# Patient Record
Sex: Male | Born: 1970
Health system: Southern US, Community
[De-identification: ages and names within clinical notes are randomized; demographics above are authoritative.]

## PROBLEM LIST (undated history)

## (undated) DIAGNOSIS — F32A Depression, unspecified: Secondary | ICD-10-CM

## (undated) DIAGNOSIS — G709 Myoneural disorder, unspecified: Secondary | ICD-10-CM

## (undated) DIAGNOSIS — F329 Major depressive disorder, single episode, unspecified: Secondary | ICD-10-CM

## (undated) HISTORY — DX: Myoneural disorder, unspecified: G70.9

## (undated) HISTORY — DX: Depression, unspecified: F32.A

## (undated) HISTORY — DX: Major depressive disorder, single episode, unspecified: F32.9

---

## 1998-06-03 ENCOUNTER — Ambulatory Visit (HOSPITAL_COMMUNITY): Admission: RE | Admit: 1998-06-03 | Discharge: 1998-06-03 | Payer: Self-pay | Admitting: Gastroenterology

## 2014-09-30 ENCOUNTER — Ambulatory Visit (INDEPENDENT_AMBULATORY_CARE_PROVIDER_SITE_OTHER): Payer: No Typology Code available for payment source | Admitting: Internal Medicine

## 2014-09-30 VITALS — BP 120/74 | HR 78 | Temp 98.7°F | Resp 16 | Ht 68.0 in | Wt 163.0 lb

## 2014-09-30 DIAGNOSIS — G4700 Insomnia, unspecified: Secondary | ICD-10-CM

## 2014-09-30 DIAGNOSIS — R002 Palpitations: Secondary | ICD-10-CM

## 2014-09-30 DIAGNOSIS — K219 Gastro-esophageal reflux disease without esophagitis: Secondary | ICD-10-CM

## 2014-09-30 DIAGNOSIS — R5383 Other fatigue: Secondary | ICD-10-CM

## 2014-09-30 LAB — POCT URINALYSIS DIPSTICK
BILIRUBIN UA: NEGATIVE
GLUCOSE UA: NEGATIVE
KETONES UA: NEGATIVE
LEUKOCYTES UA: NEGATIVE
Nitrite, UA: NEGATIVE
Protein, UA: NEGATIVE
Spec Grav, UA: 1.025
Urobilinogen, UA: 0.2
pH, UA: 5.5

## 2014-09-30 LAB — LIPID PANEL
Cholesterol: 183 mg/dL (ref 0–200)
HDL: 56 mg/dL (ref >39)
LDL Cholesterol: 80 mg/dL (ref 0–99)
Total CHOL/HDL Ratio: 3.3 Ratio
Triglycerides: 237 mg/dL — ABNORMAL HIGH (ref <150)
VLDL: 47 mg/dL — ABNORMAL HIGH (ref 0–40)

## 2014-09-30 LAB — POCT CBC
Granulocyte percent: 62.4 %G (ref 37–80)
HEMATOCRIT: 49.4 % (ref 43.5–53.7)
HEMOGLOBIN: 16.1 g/dL (ref 14.1–18.1)
LYMPH, POC: 2.2 (ref 0.6–3.4)
MCH, POC: 30.1 pg (ref 27–31.2)
MCHC: 32.5 g/dL (ref 31.8–35.4)
MCV: 92.6 fL (ref 80–97)
MID (cbc): 0.5 (ref 0–0.9)
MPV: 6.8 fL (ref 0–99.8)
POC Granulocyte: 4.4 (ref 2–6.9)
POC LYMPH PERCENT: 30.5 %L (ref 10–50)
POC MID %: 7.1 %M (ref 0–12)
Platelet Count, POC: 197 10*3/uL (ref 142–424)
RBC: 5.34 M/uL (ref 4.69–6.13)
RDW, POC: 13.8 %
WBC: 7.1 10*3/uL (ref 4.6–10.2)

## 2014-09-30 LAB — COMPREHENSIVE METABOLIC PANEL
ALT: 35 U/L (ref 0–53)
AST: 20 U/L (ref 0–37)
Albumin: 4.2 g/dL (ref 3.5–5.2)
Alkaline Phosphatase: 56 U/L (ref 39–117)
BUN: 17 mg/dL (ref 6–23)
CALCIUM: 9.2 mg/dL (ref 8.4–10.5)
CHLORIDE: 101 meq/L (ref 96–112)
CO2: 30 mEq/L (ref 19–32)
Creat: 0.83 mg/dL (ref 0.50–1.35)
Glucose, Bld: 94 mg/dL (ref 70–99)
Potassium: 4.5 mEq/L (ref 3.5–5.3)
SODIUM: 137 meq/L (ref 135–145)
Total Bilirubin: 0.4 mg/dL (ref 0.2–1.2)
Total Protein: 7 g/dL (ref 6.0–8.3)

## 2014-09-30 LAB — TSH: TSH: 1.802 u[IU]/mL (ref 0.350–4.500)

## 2014-09-30 LAB — POCT UA - MICROSCOPIC ONLY
Bacteria, U Microscopic: NEGATIVE
Casts, Ur, LPF, POC: NEGATIVE
Crystals, Ur, HPF, POC: NEGATIVE
Mucus, UA: NEGATIVE
Yeast, UA: NEGATIVE

## 2014-09-30 LAB — GLUCOSE, POCT (MANUAL RESULT ENTRY): POC Glucose: 101 mg/dl — AB (ref 70–99)

## 2014-09-30 MED ORDER — OMEPRAZOLE 20 MG PO CPDR: 20.0000 mg | DELAYED_RELEASE_CAPSULE | Freq: Every day | ORAL | Status: DC

## 2014-09-30 MED ORDER — CLONAZEPAM 1 MG PO TABS: 1.0000 mg | ORAL_TABLET | Freq: Two times a day (BID) | ORAL | Status: DC | PRN

## 2014-09-30 NOTE — Progress Notes (Signed)
Subjective:    Patient ID: Samuel Dennis, male    DOB: 1971/02/16, 44 y.o.   MRN: [id number]  HPI CO hear burn after meals. No anorexia or weight loss. Has off and on palpitations, no dizzy or syncope. CO insomnia now more from worry than anything. 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   Review of Systems  Constitutional: Positive for fatigue. Negative for fever, chills, diaphoresis, activity change, appetite change and unexpected weight change.  HENT: Negative.   Eyes: Negative.   Respiratory: Negative.   Cardiovascular: Positive for palpitations. Negative for chest pain and leg swelling.  Gastrointestinal: Negative.   Endocrine: Negative.   Genitourinary: Negative.   Musculoskeletal: Negative.   Allergic/Immunologic: Negative.   Neurological: Negative.   Hematological: Negative.   Psychiatric/Behavioral: Negative.        Objective:   Physical Exam  Constitutional: He is oriented to person, place, and time. He appears well-developed and well-nourished. No distress.  HENT:  Head: Normocephalic.  Right Ear: External ear normal.  Left Ear: External ear normal.  Nose: Nose normal.  Mouth/Throat: Oropharynx is clear and moist.  Eyes: Conjunctivae and EOM are normal. Pupils are equal, round, and reactive to light.  Neck: Normal range of motion. Neck supple. No tracheal deviation present. No thyromegaly present.  Cardiovascular: Normal rate, regular rhythm, normal heart sounds and intact distal pulses.   Pulmonary/Chest: Effort normal and breath sounds normal.  Abdominal: Soft.  Genitourinary: Rectum normal, prostate normal and penis normal.  Musculoskeletal: He exhibits no edema or tenderness.  Lymphadenopathy:    He has no cervical adenopathy.  Neurological: He is alert and oriented to person, place, and time. No cranial nerve deficit. He exhibits normal muscle tone. Coordination normal.  Skin: Skin is warm and dry.  Psychiatric: He has a normal mood  and affect. His behavior is normal. Judgment and thought content normal.     Results for orders placed or performed in visit on 09/30/14  POCT CBC  Result Value Ref Range   WBC 7.1 4.6 - 10.2 K/uL   Lymph, poc 2.2 0.6 - 3.4   POC LYMPH PERCENT 30.5 10 - 50 %L   MID (cbc) 0.5 0 - 0.9   POC MID % 7.1 0 - 12 %M   POC Granulocyte 4.4 2 - 6.9   Granulocyte percent 62.4 37 - 80 %G   RBC 5.34 4.69 - 6.13 M/uL   Hemoglobin 16.1 14.1 - 18.1 g/dL   HCT, POC 16.6 06.3 - 53.7 %   MCV 92.6 80 - 97 fL   MCH, POC 30.1 27 - 31.2 pg   MCHC 32.5 31.8 - 35.4 g/dL   RDW, POC 01.6 %   Platelet Count, POC 197 142 - 424 K/uL   MPV 6.8 0 - 99.8 fL  POCT glucose (manual entry)  Result Value Ref Range   POC Glucose 101 (A) 70 - 99 mg/dl  POCT UA - Microscopic Only  Result Value Ref Range   WBC, Ur, HPF, POC 0-1    RBC, urine, microscopic 0-2    Bacteria, U Microscopic neg    Mucus, UA neg    Epithelial cells, urine per micros 0-1    Crystals, Ur, HPF, POC neg    Casts, Ur, LPF, POC neg    Yeast, UA neg   POCT urinalysis dipstick  Result Value Ref Range   Color, UA yellow    Clarity, UA clear    Glucose, UA neg    Bilirubin, UA  neg    Ketones, UA neg    Spec Grav, UA 1.025    Blood, UA small    pH, UA 5.5    Protein, UA neg    Urobilinogen, UA 0.2    Nitrite, UA neg    Leukocytes, UA Negative    EKG normal     Assessment & Plan:  Healthy exam GERD Insomnia Palpitations

## 2014-09-30 NOTE — Patient Instructions (Signed)
cgInsomnio (Insomnia) El insomnio es un trastorno frecuente en la capacidad para dormirse o para Personal assistantpermanecer dormido. Puede ser un problema crnico o un trastorno del momento. En ambos casos es un problema frecuente. Puede tratarse de un problema del momento cuando se relaciona con alguna situacin de estrs o preocupacin. Es un trastorno crnico cuando se relaciona con situaciones de Armed forces operational officerestrs durante las horas de vigilia o con malos hbitos de sueo. Con el tiempo, la privacin del sueo en s misma, puede hacer que el problema empeore. Las cosas ms pequeas se agravan debido al cansancio y a que la capacidad para enfrentarlas disminuye. CAUSAS  Estrs, ansiedad y depresin.  Malos hbitos para dormir.  Distracciones como mirar TV en la cama.  Siestas en horarios prximos a la hora de ir a dormir.  Involucrarse en conversaciones de gran carga emocional antes de ir a dormir.  Leer textos tcnicos antes de dormir.  Consumir alcohol y otros sedantes. Ellos pueden empeorar el problema. Pueden modificar los patrones de sueo normales y la normal Iraqactividad onrica.  Consumir estimulantes como cafena algunas horas antes de ir a dormir.  Sndromes dolorosos y las dificultades respiratorias pueden causar insomnio.  Realizar ejercicios a ltima hora de la noche.  El cambio en las zonas horarias puede causar trastornos del sueo (jet lag). En algunos casos se recomienda que otra persona observe sus patrones de sueo. Deben observar los perodos en los que no respira durante la noche (apnea del sueo). Tambin deben observar cunto tiempo duran esos perodos. Si vive slo o no tiene una persona de confianza que lo observe, podr concurrir a una clnica del sueo, en la que lo controlarn de Lutsenmanera profesional. La apnea del sueo requiere controles y TEFL teachertratamiento. Entregue su historia clnica al profesional que lo asiste Tambin infrmele las observaciones que sus familiares hayan hecho con respecto a  sus hbitos de sueo.  SNTOMAS  Sentir por la maana que no ha descansado lo suficiente.  Ansiedad y agitacin a la hora de dormir  Dificultad para dormirse o para Arts administratorcontinuar el sueo. TRATAMIENTO  El Ecolabprofesional le indicar un tratamiento para los trastornos subyacentes. Tambin podr aconsejarlo o ayudarlo si usted toma alcohol o se automedica con otras drogas. El tratamiento de los problemas subyacentes generalmente eliminarn los problemas de insomnio.  Podrn prescribirle medicamentos para usar durante un plazo breve. Generalmente no se recomiendan para un uso prolongado.  Generalmente no se recomiendan los medicamentos de venta libre para un uso prolongado. Podran causarle adiccin.  Puede hacer ms fcil el conciliar el sueo si realiza modificaciones en su estilo de vida tales como:  Utilizar tcnicas de relajacin que favorecen la respiracin y reducen la tensin muscular.  No practicar actividad fsica en las ltimas horas del da.  Modificar la dieta y el horario de la ltima comida. No tomar colaciones durante la noche  Trate de establecer una hora habitual para irse a dormir.  La psicoterapia puede ser de utilidad para los problemas que le ocasionan estrs y preocupaciones.  Si hay un ambiente ruidoso y no Advertising account executivepuede evitarlo, puede ser til Optometristescuchar msica suave o sonidos agradables.  Suspenda los trabajos tediosos y Liberty Globaldetallados al menos una hora antes de ir a dormir. INSTRUCCIONES PARA EL CUIDADO DOMICILIARIO  Lleve un diario. Infrmele al profesional que lo asiste sus progresos. Esto incluye todos los efectos secundarios de los medicamentos. Concurra regularmente a la Psychiatristconsulta con el profesional [place]ome nota de:  La hora en que se duerme.  Las Golden West Financialhoras en las  que permanece despierto durante la noche.  La calidad del sueo.  Cmo se siente al da siguiente. Esta informacin ayudar a que Biochemist, clinical.   Levntese de la cama si permanece despierto  por ms de 15 minutos. Lea o realice alguna actividad tranquila. Mantenga las luces bajas. Espere hasta que sienta sueo y luego vuelva a la cama.  Mantenga un ritmo constante de vigilia y sueo. Evite las siestas.  Practique actividad fsica con regularidad.  Evite las distracciones en el momento de ir a dormir. Entre las distracciones se Production assistant, radio ver televisin o Education officer, environmental alguna actividad intensa o [place], hacer las cuentas de los gastos domsticos.  Programe un ritual para irse a dormir. Mantenga una rutina familiar relacionada con el bao diario, el cepillado de los [place], irse a la cama todas las noches a la misma hora, Tax adviser Marcus. La rutina aumenta el xito de conciliar el sueo ms rpido.  Use tcnicas de relajacin. Practique rutinas que favorezcan la respiracin y alivien la tensin muscular. Tambin puede ayudarlo la visualizacin de escenas pacficas. Tambin puede tratar de [employer] pensamientos problemticos o molestos si mantiene la mente ocupada con pensamientos repetitivos o aburridos, como el antiguo consejo de Multimedia programmer. Tambin puede ser ms creativo, e imaginar que planta hermosas flores en su jardn, una detrs de la otra.  Durante el da, trabaje para Chief Strategy Officer. Cuando no es posible, algunas de las sugerencias ya presentadas lo ayudarn a reducir la ansiedad que acompaa las situaciones de estrs. EST SEGURO QUE:   Comprende las instrucciones para el alta mdica.  Controlar su enfermedad.  Solicitar atencin mdica de inmediato segn las indicaciones. Document Released: 09/05/2005 Document Revised: 11/28/2011 [facility] Patient Information 2015 [place], [place]. This information is not intended to replace advice given to you by your health care provider. Make sure you discuss any questions you have with your health care provider. Reflujo gastroesofgico - Adultos  (Gastroesophageal Reflux Disease, Adult)  El reflujo gastroesofgico  ocurre cuando el cido del estmago pasa al esfago. Cuando el cido entra en contacto con el esfago, el cido provoca dolor (inflamacin) en el esfago. Con el tiempo, pueden formarse pequeos agujeros (lceras) en el revestimiento del esfago. CAUSAS   Exceso de Runner, broadcasting/film/video. Esto aplica presin [employer], lo que hace que el cido del estmago suba hacia el esfago.  El hbito de fumar Aumenta la produccin de cido en el [place].  El consumo de alcohol. Provoca disminucin de la presin en el esfnter esofgico inferior (vlvula o anillo de msculo entre el esfago y Investment banker, corporate), permitiendo que el cido del estmago suba hacia el esfago.  Cenas a ltima hora del da y estmago lleno. Aumenta la presin y la produccin de cido en el estmago.  Malformacin en el esfnter esofgico inferior. A menudo no se halla causa.  SNTOMAS   Ardor y Radiographer, therapeutic parte inferior del pecho detrs del esternn y en la zona media del [place]. Puede ocurrir [employer] por semana o ms a menudo.  Dificultad para tragar.  Dolor de Advertising copywriter.  Tos seca.  Sntomas similares al asma que incluyen sensacin de opresin en el pecho, falta de aire y sibilancias. DIAGNSTICO  El mdico diagnosticar el problema basndose en los sntomas. En algunos casos, se indican radiografas y otras pruebas para verificar si hay complicaciones o para comprobar el estado del [address] y Training and development officer.  TRATAMIENTO  El mdico le indicar medicamentos de venta libre o recetados para  ayudar a disminuir la produccin de cido. Consulte con su mdico antes de Corporate investment banker o agregar cualquier medicamento nuevo.  INSTRUCCIONES PARA EL CUIDADO EN EL HOGAR   Modifique los factores que pueda cambiar. Consulte con su mdico para solicitar orientacin relacionada con la prdida de peso, dejar de fumar y el consumo de alcohol.  Evite las comidas y bebidas que [address] [place], [place]:  [place] con cafena o  alcohlicas.  Chocolate.  Sabores a Advertising account planner.  Ajo y cebolla.  Comidas muy condimentadas.  Ctricos como naranjas, limones o limas.  Alimentos que contengan tomate, como salsas, [place] y pizza.  Alimentos fritos y Lexicographer.  Evite acostarse durante 3 horas antes de irse a dormir o antes de tomar una siesta.  Haga comidas pequeas durante Glass blower/designer de 3 comidas abundantes.  Use ropas sueltas. No use nada apretado alrededor de la cintura que cause presin en el estmago.  Levante (eleve) la cabecera de la cama 6 a 8 pulgadas (15 a 20 cm) con bloques de madera. Usar almohadas extra no ayuda.  Solo tome medicamentos que se pueden comprar sin receta o recetados para el dolor, Dentist o fiebre, como le indica el mdico.  No tome aspirina, ibuprofeno ni antiinflamatorios no esteroides. SOLICITE ATENCIN MDICA DE [employer] SI:   [employer], el cuello, la [place], los dientes o la espalda.  El dolor aumenta o cambia la intensidad o la durancin.  Tiene nuseas, vmitos o sudoracin(diaforesis).  Siente falta de aire o dolor en el pecho, o se desmaya.  Vomita y el vmito tiene [place], es de color [place], [place], negro o es similar a la borra del caf o tiene [place].  Las heces son rojas, sanguinolentas o negras. Estos sntomas pueden ser signos de [address], como enfermedades cardacas, hemorragias gstrias o sangrado esofgico.  ASEGRESE DE QUE:   Comprende estas instrucciones.  Controlar su enfermedad.  Solicitar ayuda de inmediato si no mejora o si empeora. Document Released: 06/15/2005 Document Revised: 11/28/2011 [facility] Patient Information 2015 [place], [place]. This information is not intended to replace advice given to you by your health care provider. Make sure you discuss any questions you have with your health care provider.. Sleep Hygiene   Sleep hygiene education - Sleep hygiene teaches good sleeping habits . This  includes:  ?Sleep only as much as necessary to feel rested and then get out of bed. ?Maintain a regular sleep schedule (the same bedtime and wake time every day). ?Do not force sleep. ?Avoid caffeinated beverages after lunch. ?Avoid alcohol near bedtime. ?Do not smoke (particularly during the evening). ?Do not go to bed hungry. ?Adjust the bedroom environment (light, noise, temperature) so that you are comfortable before you lie down. ?Deal with concerns or worries before bedtime. Make a list of things to work on for the next day so anxiety is reduced at night. ?Exercise regularly, preferably four or more hours before bedtime. ?Avoid prolonged use of phones or reading devices ("e-books") that give off light before bed. This can make it harder to fall asleep.  Relaxation - Relaxation therapy involves progressively relaxing your muscles from your head down to your feet. Here is a sample of a relaxation program: Beginning with the muscles in your face, squeeze (contract) your muscles gently for one to two seconds and then relax. Repeat several times. Use the same technique for other muscle groups, usually in the following sequence: jaw and neck, shoulders, upper arms, lower arms, fingers, chest, abdomen, buttocks, thighs,  calves, and feet. Repeat this cycle for 45 minutes, if necessary. This relaxation program can promote restfulness and sleep. Relaxation therapy is sometimes combined with biofeedback.  Biofeedback - Biofeedback uses sensors placed on your skin to track muscle tension or brain rhythms. You can see a display of your tension level or activity, allowing you to gauge your level of tension and develop strategies to reduce this tension. As an example, you may slow your breathing, progressively relax muscles, or practice deep breathing to reduce tension.  Stimulus control - Stimulus control therapy is based on the idea that some people with insomnia have learned to associate the bedroom with  staying awake rather than sleeping. ?You should spend no more than 20 minutes lying in bed trying to fall asleep. ?If you cannot fall asleep within 20 minutes, get up, go to another room and read or find another relaxing activity until you feel sleepy again. Activities such as eating, balancing your checkbook, doing housework, watching TV, or studying for a test, which "reward" you for staying awake, should be avoided. ?When you start to feel sleepy, you can return to bed. If you cannot fall asleep in another 20 minutes, repeat the process. ?Set an alarm clock and get up at the same time every day, including weekends. ?Do not take a nap during the day. You may not sleep much on the first night. However, sleep is more likely on succeeding nights because sleepiness is increased and naps are not allowed. Sleep restriction - Some people with insomnia have long awakenings during the night and some try to deal with their poor sleep by staying in bed longer in the morning to "make up" some of their lost sleep. This additional sleep later in the morning may make it more difficult to fall asleep that night, resulting in the need to stay in bed even longer the following morning. Sleep restriction consolidates sleep and breaks this cycle. ?The first step in sleep restriction therapy is to estimate the average number of hours per night that you sleep. Decrease the total time allowed in bed per night to that average sleep time, as long as it is not less than four hours. ?A rigid bedtime and awakening time are recommended and naps are not permitted. This causes partial sleep deprivation, which increases your ability to sleep the next night. ?Once sleep has improved, you may slowly increase your time in bed to find your needed hours of sleep. During the first few days to weeks, you may feel sleepy during the day and may have difficulty being alert. You can deal with this by increasing activity levels when sleepy,  avoiding sedentary activities, and discussing the sleep restriction therapy with your therapist, who may need to fine tune sleep times. It is best to try sleep restriction therapy with a therapist because reducing sleep too much can produce sleepiness that can result in accidents.

## 2015-07-14 ENCOUNTER — Ambulatory Visit (INDEPENDENT_AMBULATORY_CARE_PROVIDER_SITE_OTHER): Payer: 59 | Admitting: Urgent Care

## 2015-07-14 VITALS — BP 110/70 | HR 79 | Temp 98.0°F | Resp 18 | Ht 67.0 in | Wt 157.1 lb

## 2015-07-14 DIAGNOSIS — F419 Anxiety disorder, unspecified: Secondary | ICD-10-CM

## 2015-07-14 DIAGNOSIS — K219 Gastro-esophageal reflux disease without esophagitis: Secondary | ICD-10-CM

## 2015-07-14 DIAGNOSIS — R066 Hiccough: Secondary | ICD-10-CM | POA: Diagnosis not present

## 2015-07-14 DIAGNOSIS — G4700 Insomnia, unspecified: Secondary | ICD-10-CM | POA: Diagnosis not present

## 2015-07-14 MED ORDER — ESOMEPRAZOLE MAGNESIUM 20 MG PO CPDR: 20.0000 mg | DELAYED_RELEASE_CAPSULE | Freq: Every day | ORAL | Status: DC

## 2015-07-14 MED ORDER — FLUOXETINE HCL 20 MG PO TABS
20.0000 mg | ORAL_TABLET | Freq: Every day | ORAL | Status: DC
Start: 2015-07-14 — End: 2015-10-17

## 2015-07-14 MED ORDER — LORAZEPAM 0.5 MG PO TABS: 0.5000 mg | ORAL_TABLET | Freq: Two times a day (BID) | ORAL | Status: DC | PRN

## 2015-07-14 NOTE — Patient Instructions (Signed)
Trastorno de ansiedad generalizada (Generalized Anxiety Disorder) El trastorno de ansiedad generalizada es un trastorno mental. Interfiere en las funciones vitales, incluyendo las [place], el trabajo y la escuela.  Es diferente de la ansiedad normal que todas las personas experimentan en algn momento de su vida en respuesta a sucesos y Chief Operating Officer. En verdad, la ansiedad normal nos ayuda a prepararnos y Human resources officer acontecimientos y actividades de la vida. La ansiedad normal desaparece despus de que el evento o la actividad ha finalizado.  El trastorno de ansiedad generalizada no est necesariamente relacionada con eventos o actividades especficas. Tambin causa un exceso de ansiedad en proporcin a sucesos o actividades especficas. En este trastorno la ansiedad es difcil de Chief Operating Officer. Los sntomas pueden variar de leves a muy graves. Las personas que sufren de trastorno de ansiedad generalizada pueden tener intensas olas de ansiedad con sntomas fsicos (ataques de pnico).  SNTOMAS  La ansiedad y la preocupacin asociada a este trastorno son difciles de Chief Operating Officer. Esta ansiedad y la preocupacin estn relacionados con muchos eventos de la vida y sus actividades y tambin ocurre durante ms [employer] que no ocurre, durante 6 meses o ms. Las personas que la sufren pueden tener tres o ms de los siguientes sntomas (uno o ms en los nios):   Glass blower/designer.  Dificultades de concentracin.   Irritabilidad.  Tensin muscular  Dificultad para dormirse o sueo poco satisfactorio. DIAGNSTICO  Se diagnostica a travs de una evaluacin realizada por el mdico. El mdico le har preguntas acerca de su estado de nimo, sntomas fsicos y sucesos de [place] vida. Le har preguntas sobre su historia clnica, el consumo de alcohol o drogas, incluyendo los medicamentos recetados. N[employer] Corporation un examen fsico e indicar anlisis de [place]. Ciertas enfermedades y el uso de  determinadas sustancias pueden causar sntomas similares a este trastorno. Su mdico lo puede derivar a Music therapist en salud mental para una evaluacin ms profunda.Gerlean Ren  Las terapias siguientes se utilizan en el tratamiento de este trastorno:   Medicamentos - Se recetan antidepresivos para el control diario a Air cabin crew. Pueden indicarse tambin medicamentos para combatir la [employer] graves, especialmente cuando ocurren ataques de pnico.   Terapia conversada (psicoterapia) Ciertos tipos de psicoterapia pueden ser tiles en el tratamiento del trastorno de ansiedad generalizada, proporcionando apoyo, educacin y Optometrist. Una forma de psicoterapia llamada terapia cognitivo-conductual puede ensearle formas saludables de pensar y Publishing rights manager a los eventos y actividades de la vida diaria.  Tcnicasde manejo del estrs- Estas tcnicas incluyen el yoga, la meditacin y el ejercicio y pueden ser muy tiles cuando se practican con regularidad. Un especialista en salud mental puede ayudar a determinar qu tratamiento es mejor para usted. Algunas personas obtienen mejora con una terapia. Sin embargo, Economist requieren una combinacin de terapias.    Esta informacin no tiene Theme park manager el consejo del mdico. Asegrese de hacerle al mdico cualquier pregunta que tenga.   Document Released: 12/31/2012 Document Revised: 09/26/2014 Elsevier Interactive Patient Education 2016 [employer].   Lorazepam tablets Qu es este medicamento? El LORAZEPAM es una benzodiacepina. Se utiliza para tratar la ansiedad. Este medicamento puede ser utilizado para otros usos; si tiene alguna pregunta consulte con su proveedor de atencin mdica o con su farmacutico. Qu le debo informar a mi profesional de la salud antes de tomar este medicamento? Necesita saber si usted presenta alguno de los siguientes problemas o situaciones: -problema de alcoholismo o  drogadiccin -  trastorno bipolar, depresin, psicosis u otros problemas de salud mental -glaucoma -enfermedad renal o heptica -enfermedad pulmonar o dificultades respiratorias -miastenia gravis -enfermedad de Parkinson -convulsiones o antecedentes de convulsiones -ideas suicidas -una reaccin alrgica o inusual al lorazepam, a otras benzodiacepinas, alimentos, colorantes o conservantes -si est embarazada o buscando quedar embarazada -si est amamantando a un beb Cmo debo utilizar este medicamento? Tome este medicamento por va oral con un vaso de agua. Siga las instrucciones de la etiqueta del [place]. Si el Social worker, tmelo con alimentos o con [place]. Tome sus dosis a intervalos regulares. No tome su medicamento con una frecuencia mayor a la indicada. No deje de tomarlo excepto si as lo indica su mdico o su profesional de [employer]. Hable con su pediatra para informarse acerca del uso de este medicamento en nios. Puede requerir atencin especial. Sobredosis: Pngase en contacto inmediatamente con un centro toxicolgico o una sala de urgencia si usted cree que haya tomado demasiado medicamento. ATENCIN: [employer] American es solo para usted. No comparta este medicamento con nadie. Qu sucede si me olvido de una dosis? Si olvida una dosis, tmela lo antes posible. Si es casi la hora de la prxima dosis, tome slo esa dosis. No tome dosis adicionales o dobles. Qu puede interactuar con este medicamento? -barbitricos para inducir el sueo o para el tratamiento de convulsiones, tal como el fenobarbital -clozapina -medicamentos para la depresin, problemas mentales o trastornos psiquitricos -medicamentos para dormir -fenitona -probenecid -teofilina -cido valproico Puede ser que esta lista no menciona todas las posibles interacciones. Informe a su profesional de [employer] de In[employer] Inc productos a base de hierbas, medicamentos de [place] o  suplementos nutritivos que est tomando. Si usted fuma, consume bebidas alcohlicas o si utiliza drogas ilegales, indqueselo tambin a su profesional de [employer]. Algunas sustancias pueden interactuar con su medicamento. A qu debo estar atento al usar PPL Corporation? Visite a su mdico o a su profesional de la salud para chequear su evolucin peridicamente. Su cuerpo puede hacerse dependiente del medicamento, por esta razn, pregunte a su mdico o a su profesional de la salud si todava necesita tomarlo. Sin embargo, si ha venido Consolidated Edison de [place] regular durante algn tiempo, no deje de tomarlo repentinamente. Debe reducir gradualmente la dosis para no sufrir efectos secundarios severos. Consulte a su mdico o a su profesional de la salud antes de aumentar o reducir la dosis. Aun despus de dejar de tomarlo, los efectos del medicamento en su cuerpo pueden perdurar Care[employer] Rx. Puede experimentar somnolencia o mareos. No conduzca ni utilice maquinaria, ni haga nada que Scientist, research (life sciences) en estado de alerta hasta que sepa cmo le afecta este medicamento. Para reducir el riesgo de mareos o [place], no se ponga de pie ni se siente con rapidez, especialmente si es un paciente de edad avanzada. El alcohol puede aumentar su somnolencia y [place]. Evite consumir bebidas alcohlicas. No se trate usted mismo si tiene tos, resfro o Environmental consultant sin Science writer a su mdico o a su profesional de Radiographer, therapeutic. Algunos ingredientes pueden aumentar los posibles efectos secundarios. Qu efectos secundarios puedo tener al [employer] este medicamento? Efectos secundarios que debe informar a su mdico o a Producer, television/film/video de la salud tan pronto como sea posible: -cambios en la visin -confusin -depresin -cambios de humor, excitabilidad o comportamiento agresivo -dificultades con los movimientos, sensacin de vrtigo o movimientos entrecortados -calambres musculares -inquietud -cansancio o  debilidad Efectos secundarios que, por lo general, no  requieren atencin mdica (debe informarlos a su mdico o a su profesional de la salud si persisten o si son molestos): -estreimiento o diarrea -dificultad para conciliar el sueo, pesadillas -mareos o somnolencia -dolor de cabeza -nuseas, vmito Puede ser que esta lista no menciona todos los posibles efectos secundarios. Comunquese a su mdico por asesoramiento mdico Hewlett-Packardsobre los efectos secundarios. Usted puede informar los efectos secundarios a la FDA por telfono al 1-800-FDA-1088. Dnde debo guardar mi medicina? Mantngala fuera del alcance de los nios. Este medicamento puede ser abusado. Mantenga su medicamento en un lugar seguro para protegerlo contra robos. No comparta este medicamento con nadie. Es peligroso vender o Restaurant manager, fast foodregalar este medicamento y est prohibido por la ley. Gurdela a Sanmina-SCItemperatura ambiente, entre 20 y 25 grados C ([phone] y [phone] grados F). Protjala de la luz. Mantenga el envase bien cerrado. Deseche todo el medicamento que no haya utilizado, despus de la fecha de vencimiento. ATENCIN: Este folleto es un resumen. Puede ser que no cubra toda la posible informacin. Si usted tiene preguntas acerca de esta medicina, consulte con su mdico, su farmacutico o su profesional de Radiographer, therapeuticla salud.    2016, Elsevier/Gold Standard. (2014-10-28 00:00:00)    Fluoxetine capsules or tablets (Depression/Mood Disorders) Qu es este medicamento? La FLUOXETINA pertenece a un grupo de medicamentos llamados inhibidores selectivos de la recaptacin de serotonina (ISRS). Ayuda a tratar problemas de estado de nimo, tales como depresin, trastorno obsesivo-compulsivo y trastorno de pnico. Tambin puede tratar ciertos trastornos de la alimentacin. Este medicamento puede ser utilizado para otros usos; si tiene alguna pregunta consulte con su proveedor de atencin mdica o con su farmacutico. Qu le debo informar a mi profesional de la salud antes de  tomar este medicamento? Necesita saber si usted presenta alguno de los siguientes problemas o situaciones: -trastorno bipolar o mana -diabetes -glaucoma -enfermedad heptica -psicosis -convulsiones -ideas suicidas o antecedentes de intento de suicidio -una reaccin alrgica o inusual a la fluoxetina, otros medicamentos, alimentos, colorantes o conservantes -si est embarazada o buscando quedar embarazada -si est amamantando a un beb Cmo debo utilizar este medicamento? Tome este medicamento por va oral con un vaso de agua. Siga las instrucciones de la etiqueta del [place]medicamento. Puede tomar este medicamento con o sin alimentos. Tome sus dosis a intervalos regulares. No tome su medicamento con una frecuencia mayor a la indicada. No deje de tomar PPL Corporationeste medicamento repentinamente a menos que as indique su mdico. El detener este medicamento demasiado rpido puede causar efectos secundarios graves o puede empeorar su condicin. Su farmacutico le dar una [place] del medicamento especial con cada receta y relleno. Asegrese de leer esta informacin cada vez cuidadosamente. Hable con su pediatra para informarse acerca del uso de este medicamento en nios. Aunque este medicamento puede ser recetado a nios tan menores como de 7 aos de edad para condiciones selectivas, las precauciones se aplican. Sobredosis: Pngase en contacto inmediatamente con un centro toxicolgico o una sala de urgencia si usted cree que haya tomado demasiado medicamento. ATENCIN: Reynolds AmericanEste medicamento es solo para usted. No comparta este medicamento con nadie. Qu sucede si me olvido de una dosis? Si olvida una dosis, sltese la dosis Monacoolvidada y vuelva al horario habitual de sus dosis. No tome dosis adicionales o dobles. Qu puede interactuar con este medicamento? No tome este medicamento con ninguno de los siguientes medicamentos: -otros medicamentos que contienen fluoxetina, tales como [place] o  Symbyax -cisapride -linezolid -IMAOs, tales como Carbex, Eldepryl, Marplan, Nardil y Parnate -azul de metileno (va intravenosa) -pimozida -  tioridazina Este medicamentotambin puede interactuar con los siguientes medicamentos: -alcohol -aspirina o medicamentos tipo aspirina -carbamazepina -ciertos medicamentos para la depresin, ansiedad o trastornos psicticos -ciertos medicamentos para migraas, tales como almotriptn, eletriptn, frovatriptn, naratriptn, rizatriptn, sumatriptn, zolmitriptn -digoxina -diurticos -fentanilo -flecainida -furazolidona -isoniazida -litio -medicamentos para conciliar el sueo -medicamentos que tratan o previenen cogulos sanguneos, tales como warfarina, enoxaparina y dalteparina -AINE, medicamentos para el dolor o la inflamacin, tales como el ibuprofeno o naproxeno -fenitona -procarbazina -propafenona -rasagilina -ritonavir -suplementos como hierba de [place], kava kava, valeriana -tramadol -triptfano -vinblastina Puede ser que esta lista no menciona todas las posibles interacciones. Informe a su profesional de [employer] de Ingram [employer] Inc productos a base de hierbas, medicamentos de [place] o suplementos nutritivos que est tomando. Si usted fuma, consume bebidas alcohlicas o si utiliza drogas ilegales, indqueselo tambin a su profesional de [employer]. Algunas sustancias pueden interactuar con su medicamento. A qu debo estar atento al usar PPL Corporation? Informe a su mdico si sus sntomas no mejoran o si empeoran. Visite a su mdico o a su profesional de la salud para chequear su evolucin peridicamente. Debido que puede ser necesario tomar este medicamento durante varias semanas para que sea posible observar sus efectos en forma [place], es importante que sigue su tratamiento como recetado por su mdico. Los pacientes y sus familias deben estar atentos si empeora la depresin o ideas suicidas. Tambin est atento a cambios repentinos  o severos de emocin, tales como el sentirse ansioso, agitado, lleno de pnico, irritable, hostil, agresivo, impulsivo, inquietud severa, demasiado excitado y hiperactivo o dificultad para conciliar el sueo. Si esto ocurre, especialmente al comenzar con el tratamiento o al cambiar de dosis, comunquese con su mdico. Puede experimentar somnolencia o [employer]. No conduzca ni utilice maquinaria, ni haga nada que Scientist, research (life sciences) en estado de alerta hasta que sepa cmo le afecta este medicamento. No se siente ni se ponga de pie con rapidez, especialmente si es un paciente de edad avanzada. Esto reduce el riesgo de mareos o [employer]. El alcohol puede interferir con el efecto de [place]. Evite consumir bebidas alcohlicas. Se le podr secar la boca. Masticar chicle sin azcar, chupar caramelos duros y tomar agua en abundancia le ayudar a mantener la boca hmeda. Si el problema no desaparece o es severo, consulte a su mdico. Este medicamento puede afectar sus niveles de [place]. Si tiene diabetes, consulte con su mdico o profesional de la salud antes de cambiar su dieta o la dosis de su medicamento para la diabetes. Qu efectos secundarios puedo tener al [employer] este medicamento? Efectos secundarios que debe informar a su mdico o a Producer, television/film/video de la salud tan pronto como sea posible: Therapist, art, como erupcin cutnea, picazn o urticarias, hinchazn de la cara, los labios o la lengua problemas respiratorios confusin dolor ocular, cambios en la visin pulso cardaco rpido o irregular, palpitaciones sntomas gripales como fiebre, escalofros, tos, molestias y [employer] o articulares convulsiones ideas suicidas u otros cambios de humor hinchazn o enrojecimiento en o alrededor del ojo temblores dificultad para conciliar el sueo sangrado o magulladuras inusuales cansancio o debilidad inusual vmitoEfectos secundarios que, por lo general, no requieren atencin mdica  (debe informarlos a su mdico o a su profesional de la salud si persisten o si son molestos): cambios en el deseo o desempeo sexual diarrea boca seca enrojecimiento dolor de cabeza aumento o disminucin del apetito nuseas sudoracin Puede ser que esta lista no menciona todos los posibles  efectos secundarios. Comunquese a su mdico por asesoramiento mdico Hewlett-Packard. Usted puede informar los efectos secundarios a la FDA por telfono al 1-800-FDA-1088. Dnde debo guardar mi medicina? Mantngala fuera del alcance de los nios. Gurdela a Sanmina-SCI, entre 15 y 30 grados C (71 y 43 grados F). Deseche todo el medicamento que no haya utilizado, despus de la fecha de vencimiento. ATENCIN: Este folleto es un resumen. Puede ser que no cubra toda la posible informacin. Si usted tiene preguntas acerca de esta medicina, consulte con su mdico, su farmacutico o su profesional de Radiographer, therapeutic.    2016, Elsevier/Gold Standard. (2015-01-16 00:00:00)    Opciones de alimentos para pacientes con reflujo gastroesofgico - Adultos (Food Choices for Gastroesophageal Reflux Disease, Adult) Cuando se tiene reflujo gastroesofgico (ERGE), los alimentos que se ingieren y los hbitos de alimentacin son muy importantes. Elegir los alimentos adecuados puede ayudar a Paramedic las molestias ocasionadas por el [place]. QU PAUTAS GENERALES DEBO SEGUIR?  Elija las frutas, los vegetales, los cereales integrales, los productos lcteos, la carne de [place], de pescado y de ave con bajo contenido de grasas.  Limite las grasas, [address] [place], los aderezos para [place], la [place], los frutos secos y Programme researcher, broadcasting/film/video.  Lleve un registro de las comidas para identificar los alimentos que ocasionan sntomas.  Evite los alimentos que le ocasionen reflujo. Pueden ser distintos para cada persona.  Haga comidas pequeas con frecuencia en lugar de tres comidas [employer].  Coma lentamente, en  un clima distendido.  Limite el consumo de alimentos fritos.  Cocine los alimentos utilizando mtodos que no sean la fritura.  Evite el consumo alcohol.  Evite beber grandes cantidades de lquidos con las comidas.  Evite agacharse o recostarse hasta despus de 2 o 3horas de haber comido. QU ALIMENTOS NO SE RECOMIENDAN? Los siguientes son algunos alimentos y bebidas que pueden empeorar los sntomas: Veterinary surgeon. Jugo de tomate. Salsa de tomate y espagueti. Ajes. [place] y [place]. Rbano picante. Frutas Naranjas, pomelos y limn (fruta y [place]). Carnes Carnes de [place], de pescado y de ave con gran contenido de grasas. Esto incluye los perros calientes, las [place], el [place], la salchicha, el salame y el tocino. Lcteos Leche entera y [place]. PPG Industries. Crema. [place]. Helados. Queso crema.  Bebidas Caf y t negro, con o sin cafena Bebidas gaseosas o energizantes. Condimentos Salsa picante. Salsa barbacoa.  Dulces/postres Chocolate y cacao. [place]. Menta y mentol. Grasas y [employer] con alto contenido de grasas, incluidas las papas fritas. Otros Vinagre. Especias picantes, como la [employer], la pimienta blanca, la pimienta roja, la pimienta de cayena, el curry en [place], los clavos de [place], el jengibre y el [place] en polvo. Los artculos mencionados arriba pueden no ser Raytheon de las bebidas y los alimentos que se Theatre stage manager. Comunquese con el nutricionista para recibir ms informacin.   Esta informacin no tiene Theme park manager el consejo del mdico. Asegrese de hacerle al mdico cualquier pregunta que tenga.   Document Released: 06/15/2005 Document Revised: 09/26/2014 Elsevier Interactive Patient Education [employer]

## 2015-07-14 NOTE — Progress Notes (Signed)
    MRN: [id number] DOB: [birth year]  Subjective:   Samuel Dennis is a 44 y.o. male presenting for chief complaint of Medication Refill and Hiccups  Reports 1 day history of hiccups. It was intractable until today. Holding his breath, drinking water helped but only momentarily until about 18:00 today when his hiccups finally stopped. Reports unhealthy diet, feeling heartburn. Denies nausea, vomiting, abdominal pain, cough, difficulty swallowing, sore throat. Of note, patient admits significant anxiety and worrying for finances, work. Generally worries very easily. Also has difficulty sleeping, sometimes gets ~1 hour a night. He has 3 children, including a 4315, 3514 and 25 year old. Admits having difficulty with his oldest son. Otherwise has a good marriage, good relationships with his other children. Denies depression, homicidal or suicidal ideation. Denies any other aggravating or relieving factors, no other questions or concerns.  Chief has a current medication list which includes the following prescription(s): clonazepam and omeprazole. Also has No Known Allergies.  Pate  has a past medical history of Depression and Neuromuscular disorder (HCC). Also  has no past surgical history on file.  Objective:   Vitals: BP 110/70 mmHg  Pulse 79  Temp(Src) 98 F (36.7 C) (Oral)  Resp 18  Ht 5\' 7"  (1.702 m)  Wt 157 lb 2 oz (71.271 kg)  BMI 24.60 kg/m2  SpO2 98%  Physical Exam  Constitutional: He is oriented to person, place, and time. He appears well-developed and well-nourished.  HENT:  Mouth/Throat: Oropharynx is clear and moist.  Neck: Normal range of motion. Neck supple. No thyromegaly present.  Cardiovascular: Normal rate, regular rhythm and intact distal pulses.  Exam reveals no gallop and no friction rub.   No murmur heard. Pulmonary/Chest: No respiratory distress. He has no wheezes. He has no rales.  Abdominal: Soft. Bowel sounds are normal. He exhibits no distension and no  mass. There is no tenderness.  Neurological: He is alert and oriented to person, place, and time.  Skin: Skin is warm and dry. No rash noted. No erythema. No pallor.  Psychiatric: He has a normal mood and affect.   Assessment and Plan :   1. Gastroesophageal reflux disease, esophagitis presence not specified 2. Hiccups - Start Nexium, counseled on maneuvers to stop hiccups. Recheck in 6 weeks.  3. Anxiety 4. Insomnia - Start SSRI, Ativan for sleep, recheck in 6 weeks.  Wallis BambergMario Kiarah Eckstein, PA-C [facility] [phone] 07/14/2015 8:12 PM

## 2015-07-15 ENCOUNTER — Telehealth: Payer: Self-pay

## 2015-07-15 LAB — COMPREHENSIVE METABOLIC PANEL
ALT: 19 U/L (ref 9–46)
AST: 18 U/L (ref 10–40)
Albumin: 4.4 g/dL (ref 3.6–5.1)
Alkaline Phosphatase: 59 U/L (ref 40–115)
BUN: 17 mg/dL (ref 7–25)
CHLORIDE: 99 mmol/L (ref 98–110)
CO2: 28 mmol/L (ref 20–31)
CREATININE: 0.93 mg/dL (ref 0.60–1.35)
Calcium: 9.1 mg/dL (ref 8.6–10.3)
GLUCOSE: 84 mg/dL (ref 65–99)
POTASSIUM: 4 mmol/L (ref 3.5–5.3)
SODIUM: 138 mmol/L (ref 135–146)
Total Bilirubin: 0.4 mg/dL (ref 0.2–1.2)
Total Protein: 7.1 g/dL (ref 6.1–8.1)

## 2015-07-15 LAB — TSH: TSH: 2.136 u[IU]/mL (ref 0.350–4.500)

## 2015-07-15 NOTE — Telephone Encounter (Signed)
PA completed on covermymeds for generic nexium. Pt reported that he had some diagnostic testing done years ago by GI doc. Pending.

## 2015-07-20 NOTE — Telephone Encounter (Signed)
Samuel Dennis, [place] was denied because it is a plan exclusion. Alternatives that are covered are: Omeprazole (which pt has tried), pantoprazole, rabeprazole, and Dexilant. Do you want to Rx one of the three he hasn't tried before?

## 2015-07-20 NOTE — Telephone Encounter (Signed)
Kathlene NovemberMike, it looks like pt was taking omeprazole when he came in and you wanted to change him to nexium. Do you want me to send in a higher dose of omeprazole, or try pantoprazole?

## 2015-07-20 NOTE — Telephone Encounter (Signed)
Please try omeprazole. Thank you!

## 2015-07-21 NOTE — Telephone Encounter (Signed)
Lets go to 40mg  omeprazole. Thank you!

## 2015-07-23 MED ORDER — OMEPRAZOLE 40 MG PO CPDR: 40.0000 mg | DELAYED_RELEASE_CAPSULE | Freq: Every day | ORAL | Status: DC

## 2015-07-23 NOTE — Telephone Encounter (Signed)
Sent new Rx w/note to let pt know of change when ready.

## 2015-10-17 ENCOUNTER — Ambulatory Visit (INDEPENDENT_AMBULATORY_CARE_PROVIDER_SITE_OTHER): Payer: BLUE CROSS/BLUE SHIELD | Admitting: Urgent Care

## 2015-10-17 VITALS — BP 130/76 | HR 79 | Temp 97.5°F | Resp 16 | Ht 68.0 in | Wt 160.6 lb

## 2015-10-17 DIAGNOSIS — F419 Anxiety disorder, unspecified: Secondary | ICD-10-CM | POA: Diagnosis not present

## 2015-10-17 DIAGNOSIS — G4700 Insomnia, unspecified: Secondary | ICD-10-CM

## 2015-10-17 DIAGNOSIS — Z658 Other specified problems related to psychosocial circumstances: Secondary | ICD-10-CM

## 2015-10-17 DIAGNOSIS — F439 Reaction to severe stress, unspecified: Secondary | ICD-10-CM

## 2015-10-17 MED ORDER — FLUOXETINE HCL 20 MG PO TABS: 20.0000 mg | ORAL_TABLET | Freq: Every day | ORAL | Status: DC

## 2015-10-17 MED ORDER — TRAZODONE HCL 50 MG PO TABS: 25.0000 mg | ORAL_TABLET | Freq: Every evening | ORAL | Status: DC | PRN

## 2015-10-17 NOTE — Patient Instructions (Signed)
Trastorno de ansiedad generalizada (Generalized Anxiety Disorder) El trastorno de ansiedad generalizada es un trastorno mental. Interfiere en las funciones vitales, incluyendo las [place], el trabajo y la escuela.  Es diferente de la ansiedad normal que todas las personas experimentan en algn momento de su vida en respuesta a sucesos y Chief Operating Officer. En verdad, la ansiedad normal nos ayuda a prepararnos y Human resources officer acontecimientos y actividades de la vida. La ansiedad normal desaparece despus de que el evento o la actividad ha finalizado.  El trastorno de ansiedad generalizada no est necesariamente relacionada con eventos o actividades especficas. Tambin causa un exceso de ansiedad en proporcin a sucesos o actividades especficas. En este trastorno la ansiedad es difcil de Chief Operating Officer. Los sntomas pueden variar de leves a muy graves. Las personas que sufren de trastorno de ansiedad generalizada pueden tener intensas olas de ansiedad con sntomas fsicos (ataques de pnico).  SNTOMAS  La ansiedad y la preocupacin asociada a este trastorno son difciles de Chief Operating Officer. Esta ansiedad y la preocupacin estn relacionados con muchos eventos de la vida y sus actividades y tambin ocurre durante ms [employer] que no ocurre, durante 6 meses o ms. Las personas que la sufren pueden tener tres o ms de los siguientes sntomas (uno o ms en los nios):   Glass blower/designer.  Dificultades de concentracin.   Irritabilidad.  Tensin muscular  Dificultad para dormirse o sueo poco satisfactorio. DIAGNSTICO  Se diagnostica a travs de una evaluacin realizada por el mdico. El mdico le har preguntas acerca de su estado de nimo, sntomas fsicos y sucesos de [place] vida. Le har preguntas sobre su historia clnica, el consumo de alcohol o drogas, incluyendo los medicamentos recetados. [employer] un examen fsico e indicar anlisis de [place]. Ciertas enfermedades y el uso de  determinadas sustancias pueden causar sntomas similares a este trastorno. Su mdico lo puede derivar a Music therapist en salud mental para una evaluacin ms profunda.Gerlean Ren  Las terapias siguientes se utilizan en el tratamiento de este trastorno:   Medicamentos - Se recetan antidepresivos para el control diario a Air cabin crew. Pueden indicarse tambin medicamentos para combatir la [employer] graves, especialmente cuando ocurren ataques de pnico.   Terapia conversada (psicoterapia) Ciertos tipos de psicoterapia pueden ser tiles en el tratamiento del trastorno de ansiedad generalizada, proporcionando apoyo, educacin y Optometrist. Una forma de psicoterapia llamada terapia cognitivo-conductual puede ensearle formas saludables de pensar y Publishing rights manager a los eventos y actividades de la vida diaria.  Tcnicasde manejo del estrs- Estas tcnicas incluyen el yoga, la meditacin y el ejercicio y pueden ser muy tiles cuando se practican con regularidad. Un especialista en salud mental puede ayudar a determinar qu tratamiento es mejor para usted. Algunas personas obtienen mejora con una terapia. Sin embargo, Economist requieren una combinacin de terapias.    Esta informacin no tiene Theme park manager el consejo del mdico. Asegrese de hacerle al mdico cualquier pregunta que tenga.   Document Released: 12/31/2012 Document Revised: 09/26/2014 Elsevier Interactive Patient Education 2016 [employer].     Trazodone tablets Qu es este medicamento? La TRAZODONA se utiliza para tratar la depresin. Este medicamento puede ser utilizado para otros usos; si tiene alguna pregunta consulte con su proveedor de atencin mdica o con su farmacutico. Qu le debo informar a mi profesional de la salud antes de tomar este medicamento? Necesita saber si usted presenta alguno de los siguientes problemas o situaciones: -intento de suicidio o con ideas suicidas -  trastorno  bipolar -problemas sanguneos -glaucoma -enfermedad cardiaca o ataque cardiaco previo -latidos cardiacos irregulares -enfermedad renal o heptica -niveles bajos de sodio en la sangre -una reaccin alrgica o inusual a la trazodona, a otros medicamentos, alimentos, colorantes o conservantes -si est embarazada o buscando quedar embarazada -si est amamantando a un beb Cmo debo utilizar este medicamento? Tome este medicamento por va oral con un vaso de agua. Siga las instrucciones de la etiqueta del [place]. [employer] medicamento poco despus de una comida o un refrigerio liviano. Tome sus dosis a intervalos regulares. No tome su medicamento con una frecuencia mayor a la indicada. No deje de tomar [employer] repentinamente a menos que as indique su mdico. El detener este medicamento demasiado rpido puede causar efectos secundarios graves o puede empeorar su condicin. Su farmacutico le dar una gua del medicamento especial con cada receta y relleno. Asegrese de leer esta informacin cada vez cuidadosamente. Hable con su pediatra para informarse acerca del uso de este medicamento en nios. Puede requerir atencin especial. Sobredosis: Pngase en contacto inmediatamente con un centro toxicolgico o una sala de urgencia si usted cree que haya tomado demasiado medicamento. ATENCIN: [employer] es solo para usted. No comparta este medicamento con nadie. Qu sucede si me olvido de una dosis? Si olvida una dosis, tmela lo antes posible. Si es casi la hora de la prxima dosis, tome slo esa dosis. No tome dosis adicionales o dobles. Qu puede interactuar con este medicamento? No tome esta medicina con ninguno de los siguientes medicamentos: ciertos medicamentos para infecciones micticas, tales como fluconazol, quetoconazol, itraconazol, posaconazol, voriconazol cisapride dofetilida dronedarona linezolid IMAOs, tales como Carbex, Eldepryl, Marplan, Nardil y Parnate mesoridazina  azul de metileno (va intravenosa) pimozida saquinavir tioridazina ziprasidona Esta medicina tambin puede interactuar con los siguientes medicamentos: alcohol medicamentos antivricos para el VIH o SIDA aspirina o medicamentos tipo aspirina barbitricos tales como el fenobarbital ciertos medicamentos para la presin sangunea, enfermedad cardiaca, pulso cardiaco irregular ciertos medicamentos para la depresin, ansiedad o trastornos psicticos ciertos medicamentos para las migraas, tales como almotriptn, eletriptn, frovatriptn, naratriptn, rizatriptn, sumatriptn, zolmitriptn ciertos medicamentos para convulsiones, tales como carbamazepina y fenitona ciertos medicamento para conciliar el sueo ciertos medicamentos que tratan o previenen cogulos sanguneos, como dalteparina, enoxaparina, warfarina digoxina fentanilo litio los [place], medicamentos para el dolor o inflamacin, como ibuprofeno o naproxeno otros medicamentos que prolongan el intervalo QT (causa un ritmo cardiaco anormal) rasagilina medicamentos a base de hierbas que contienen kava kava, hierba de [place] o valeriana tramadol triptfano Puede ser que esta lista no menciona todas las posibles interacciones. Informe a su profesional de [employer] de [employer] productos a base de hierbas, medicamentos de [place] o suplementos nutritivos que est tomando. Si usted fuma, consume bebidas alcohlicas o si utiliza drogas ilegales, indqueselo tambin a su profesional de [employer]. Algunas sustancias pueden interactuar con su medicamento. A qu debo estar atento al usar [employer]? Informe a su mdico si sus sntomas no mejoran o si empeoran. Visite a su mdico o a su profesional de la salud para chequear su evolucin peridicamente. Debido que puede ser necesario tomar este medicamento durante varias semanas para que sea posible observar sus efectos en forma [place], es importante que sigue su tratamiento como recetado por su mdico. Los  pacientes y sus familias deben estar atentos si empeora la depresin o ideas suicidas. Tambin est atento a cambios repentinos o severos de emocin, tales como el sentirse ansioso, agitado, lleno de pnico, irritable, hostil,  agresivo, impulsivo, inquietud severa, demasiado excitado y hiperactivo o dificultad para conciliar el sueo. Si esto ocurre, especialmente al comenzar con el tratamiento o al cambiar de dosis, comunquese con su profesional de [employer]. Puede experimentar somnolencia, mareos o visin borrosa. No conduzca ni utilice maquinaria, ni haga nada que Scientist, research (life sciences) en estado de alerta hasta que sepa cmo le afecta este medicamento. No se siente ni se ponga de pie con rapidez, especialmente si es un paciente de edad avanzada. Esto reduce el riesgo de mareos o [employer]. El alcohol puede interferir con el efecto de [place]. Evite consumir bebidas alcohlicas. Este medicamento puede provocar sequedad de los ojos y visin borrosa. Su [place] lentes de contacto, puede sentir [facility]. Las gotas lubricantes pueden ayudarle. Si el problema no desaparece o es severo, visite a su mdico de ojos. Este medicamento puede secarle la boca. El Product manager chicle sin azcar, chupar caramelos duros y tomar agua en abundancia le ayudarn a mantener la boca hmeda. Si el problema no desaparece o es severo, consulte a su mdico. Qu efectos secundarios puedo tener al [employer] este medicamento? Efectos secundarios que debe informar a su mdico o a Producer, television/film/video de la salud tan pronto como sea posible: -Therapist, art como erupcin cutnea, picazn o urticarias, hinchazn de la cara, labios o lengua -pulso cardiaco rpido, irregular -sensacin de desmayos o mareos, cadas -erecciones dolorosas u otras disfunciones sexuales -ideas suicidad u otros cambios de humor -temblores Efectos secundarios que, por lo general, no requieren atencin mdica (debe informarlos a su mdico o a su  profesional de la salud si persisten o si son molestos): -estreimiento -dolor de cabeza -molestias o dolores musculares -nuseas, vmito -cansancio o debilidad inusual Puede ser que esta lista no menciona todos los posibles efectos secundarios. Comunquese a su mdico por asesoramiento mdico [employer]. Usted puede informar los efectos secundarios a la FDA por telfono al 1-800-FDA-1088. Dnde debo guardar mi medicina? Mantngala fuera del alcance de los nios. Gurdela a [employer], entre 15 y 30 grados C (45 y 69 grados F). Protjala de la luz. Mantenga el envase bien cerrado. Deseche los medicamentos que no haya utilizado, despus de la fecha de vencimiento. ATENCIN: Este folleto es un resumen. Puede ser que no cubra toda la posible informacin. Si usted tiene preguntas acerca de esta medicina, consulte con su mdico, su farmacutico o su profesional de Radiographer, therapeutic.    2016, Elsevier/Gold Standard. (2014-10-28 00:00:00)

## 2015-10-17 NOTE — Progress Notes (Signed)
    MRN: [id number] DOB: 04/04/1971  Subjective:   Samuel Dennis is a 45 y.o. male presenting for chief complaint of Insomnia and Testicular Mass  Testicular mass - reports ~2 month history of right testicular mass that has progressively increased in size. Denies fever, redness, penile discharge, testicular swelling, anal pain. Patient is in a monogamous relationship with his wife.  Anxiety - admits longstanding difficulty with worrying, anxiety. He was started on Prozac and lorazepam. But ran out of the medications and did not follow up. Patient admits that he has worried excessively for as long as he can remember. Finds it difficult to control his worrying. Denies depressed mood, irritability. Has 3 teenagers at home and is stressed out with his oldest son but admits good relationships. He also has a difficult time sleeping. Part of his difficulty with sleep comes from his stress. States that lorazepam helped some but no significant difference.  Sanad has a current medication list which includes the following prescription(s): esomeprazole, fluoxetine, lorazepam, and omeprazole. Also has No Known Allergies.  Jeric  has a past medical history of Depression and Neuromuscular disorder (HCC). Also  has no past surgical history on file.  Objective:   Vitals: BP 130/76 mmHg  Pulse 79  Temp(Src) 97.5 F (36.4 C) (Oral)  Resp 16  Ht  (1.727 m)  Wt 160 lb 9.6 oz (72.848 kg)  BMI 24.42 kg/m2  SpO2 98%  Physical Exam  Constitutional: He is oriented to person, place, and time. He appears well-developed and well-nourished.  Eyes: Pupils are equal, round, and reactive to light.  Neck: Normal range of motion. Neck supple. No thyromegaly present.  Cardiovascular: Normal rate, regular rhythm and intact distal pulses.  Exam reveals no gallop and no friction rub.   No murmur heard. Pulmonary/Chest: No respiratory distress. He has no wheezes. He has no rales.  Abdominal: Hernia  confirmed negative in the right inguinal area and confirmed negative in the left inguinal area.  Genitourinary: Cremasteric reflex is present. Right testis shows no mass, no swelling and no tenderness. Right testis is descended. Cremasteric reflex is not absent on the right side. Left testis shows no mass, no swelling and no tenderness. Left testis is descended. Cremasteric reflex is not absent on the left side. Uncircumcised. No phimosis, paraphimosis, hypospadias, penile erythema or penile tenderness. No discharge found.  Lymphadenopathy: No inguinal adenopathy noted on the right or left side.  Neurological: He is alert and oriented to person, place, and time.  Skin: Skin is warm and dry.   Assessment and Plan :   1. Anxiety 2. Insomnia 3. Stress - Restart Prozac. Patient states that it was helping him. He would like to stay at dose of  but will follow up this time. He would also like to try trazodone for sleep. Counseled on sleep hygiene.  4. GU exam was completely normal. Counseled patient on anatomy, patient has been palpating the epididymis. Patient will rtc if signs and symptoms develop as discussed in clinic.  Wallis Bamberg, PA-C [facility] [phone] 10/17/2015 3:51 PM

## 2016-02-22 DIAGNOSIS — H538 Other visual disturbances: Secondary | ICD-10-CM | POA: Diagnosis not present

## 2016-02-22 DIAGNOSIS — H11153 Pinguecula, bilateral: Secondary | ICD-10-CM | POA: Diagnosis not present

## 2016-02-22 DIAGNOSIS — H1011 Acute atopic conjunctivitis, right eye: Secondary | ICD-10-CM | POA: Diagnosis not present

## 2016-02-22 DIAGNOSIS — H04129 Dry eye syndrome of unspecified lacrimal gland: Secondary | ICD-10-CM | POA: Diagnosis not present

## 2016-03-26 ENCOUNTER — Ambulatory Visit (INDEPENDENT_AMBULATORY_CARE_PROVIDER_SITE_OTHER): Payer: BLUE CROSS/BLUE SHIELD | Admitting: Internal Medicine

## 2016-03-26 ENCOUNTER — Ambulatory Visit (INDEPENDENT_AMBULATORY_CARE_PROVIDER_SITE_OTHER): Payer: BLUE CROSS/BLUE SHIELD

## 2016-03-26 VITALS — BP 120/70 | HR 75 | Temp 98.1°F | Resp 18 | Ht 67.0 in | Wt 163.4 lb

## 2016-03-26 DIAGNOSIS — R059 Cough, unspecified: Secondary | ICD-10-CM

## 2016-03-26 DIAGNOSIS — N509 Disorder of male genital organs, unspecified: Secondary | ICD-10-CM

## 2016-03-26 DIAGNOSIS — N5089 Other specified disorders of the male genital organs: Secondary | ICD-10-CM

## 2016-03-26 DIAGNOSIS — R05 Cough: Secondary | ICD-10-CM

## 2016-03-26 MED ORDER — HYDROCODONE-HOMATROPINE 5-1.5 MG/5ML PO SYRP: 5.0000 mL | ORAL_SOLUTION | Freq: Four times a day (QID) | ORAL | Status: DC | PRN

## 2016-03-26 MED ORDER — BENZONATATE 100 MG PO CAPS: 100.0000 mg | ORAL_CAPSULE | Freq: Two times a day (BID) | ORAL | Status: DC | PRN

## 2016-03-26 MED ORDER — PREDNISONE 20 MG PO TABS: ORAL_TABLET | ORAL | Status: DC

## 2016-03-26 NOTE — Patient Instructions (Signed)
     IF you received an x-ray today, you will receive an invoice from [facility]. Please contact [facility] at [phone] with questions or concerns regarding your invoice.   IF you received labwork today, you will receive an invoice from [facility]. Please contact [facility] at [phone] with questions or concerns regarding your invoice.   Our billing staff will not be able to assist you with questions regarding bills from these companies.  You will be contacted with the lab results as soon as they are available. The fastest way to get your results is to activate your My Chart account. Instructions are located on the last page of this paperwork. If you have not heard from us regarding the results in 2 weeks, please contact this office.      

## 2016-03-26 NOTE — Progress Notes (Signed)
Subjective:  By signing my name below, I, Stann Ore, attest that this documentation has been prepared under the direction and in the presence of Ellamae Sia, MD. Electronically Signed: Stann Ore, Scribe. 03/26/2016 , 9:38 AM .  Patient was seen in Room 12 .   Patient ID: Samuel Dennis, male    DOB: 18-May-1971, 45 y.o.   MRN: [id number] Chief Complaint  Patient presents with  . Cough    x 3 weeks   HPI Samuel Dennis is a 45 y.o. male who presents to [facility] complaining of dry cough ongoing for more than 2 weeks. He states he has a "tickle in his throat". He occasionally wakes up at night due to cough. He denies fever, night sweats, shortness of breath, sore throat or nasal drainage. He denies taking chronic medications. He denies heart burn. He denies history of smoking.   He also reports having a swelling in his scrotum. He was seen for this during his previous visit in January. He believes the swelling has worsened.   There are no active problems to display for this patient.  No current outpatient prescriptions on file. No Known Allergies  Review of Systems  Constitutional: Positive for fatigue. Negative for fever, chills and diaphoresis.  HENT: Negative for rhinorrhea and sore throat.   Respiratory: Positive for cough. Negative for shortness of breath and wheezing.   Genitourinary: Positive for scrotal swelling. Negative for dysuria, frequency and hematuria.       Objective:   Physical Exam  Constitutional: He is oriented to person, place, and time. He appears well-developed and well-nourished. No distress.  HENT:  Head: Normocephalic and atraumatic.  Eyes: EOM are normal. Pupils are equal, round, and reactive to light.  Neck: Neck supple.  Cardiovascular: Normal rate, regular rhythm and normal heart sounds.   No murmur heard. Pulmonary/Chest: Effort normal and breath sounds normal. No respiratory distress.  Genitourinary:  Right testicle: 1cm firm  movable nodule at the upper pole  Musculoskeletal: Normal range of motion.  Lymphadenopathy:    He has no cervical adenopathy.  Neurological: He is alert and oriented to person, place, and time.  Skin: Skin is warm and dry.  Psychiatric: He has a normal mood and affect. His behavior is normal.  Nursing note and vitals reviewed.   BP 120/70 mmHg  Pulse 75  Temp(Src) 98.1 F (36.7 C) (Oral)  Resp 18  Ht  (1.702 m)  Wt 163 lb 6 oz (74.106 kg)  BMI 25.58 kg/m2  SpO2 98%   Dg Chest 2 View  03/26/2016  CLINICAL DATA:  Cough EXAM: CHEST  2 VIEW COMPARISON:  None. FINDINGS: Normal heart size. Lungs clear. No pneumothorax. No pleural effusion. IMPRESSION: No active cardiopulmonary disease. Electronically Signed   By: Jolaine Click M.D.   On: 03/26/2016 09:57      Assessment & Plan:  I have completed the patient encounter in its entirety as documented by the scribe, with editing by me where necessary. Kimberli Winne P. Merla Riches, M.D.  Cough -   Testicular mass - Plan: US Scrotum  Meds ordered this encounter  Medications  . HYDROcodone-homatropine (HYCODAN) 5-1.5 MG/5ML syrup    Sig: Take 5 mLs by mouth every 6 (six) hours as needed.    Dispense:  120 mL    Refill:  0  . benzonatate (TESSALON) 100 MG capsule    Sig: Take 1 capsule (100 mg total) by mouth 2 (two) times daily as needed for cough.    Dispense:  20 capsule    Refill:  0  . predniSONE (DELTASONE) 20 MG tablet    Sig: 3/3/2/2/1/1 single daily dose for 6 days    Dispense:  12 tablet    Refill:  0

## 2016-03-26 NOTE — Progress Notes (Signed)
Went into the room to speak with patient regarding cough symptoms, he also has a genital complaint and would like to see a male provider only

## 2016-03-31 ENCOUNTER — Telehealth: Payer: Self-pay

## 2016-03-31 DIAGNOSIS — N5089 Other specified disorders of the male genital organs: Secondary | ICD-10-CM

## 2016-03-31 NOTE — Telephone Encounter (Signed)
[facility] needs an order added in order to schedule the ultrasound scrotum.  They require an order for an arterial venous flow doppler.  Please add this order, so the patient can be scheduled.  Thank you.  CB#: [phone]

## 2016-04-04 NOTE — Telephone Encounter (Signed)
There is an order from 7/8 is a new order needed

## 2016-04-04 NOTE — Telephone Encounter (Signed)
See below

## 2016-04-04 NOTE — Telephone Encounter (Signed)
  Orders Placed This Encounter  Procedures  . US Art/Ven Flow Abd Pelv Doppler    Standing Status: Future     Number of Occurrences:      Standing Expiration Date: 06/05/2017    Order Specific Question:  Reason for Exam (SYMPTOM  OR DIAGNOSIS REQUIRED)    Answer:  evaluation of testicular mass    Order Specific Question:  Preferred imaging location?    Answer:  [id number]

## 2016-04-04 NOTE — Telephone Encounter (Signed)
The US Scrotum order is already in the system from 03/26/16, but the patient cannot be scheduled for the scan until an additional order is added (Arterial Venous Flow Doppler).  Both orders are required by [facility] in order to perform the ultrasound of the scrotum.

## 2016-04-15 ENCOUNTER — Ambulatory Visit
Admission: RE | Admit: 2016-04-15 | Discharge: 2016-04-15 | Disposition: A | Discharge status: Home / Self Care | Payer: BLUE CROSS/BLUE SHIELD | Source: Ambulatory Visit | Attending: Physician Assistant | Admitting: Physician Assistant

## 2016-04-15 ENCOUNTER — Ambulatory Visit
Admission: RE | Admit: 2016-04-15 | Discharge: 2016-04-15 | Disposition: A | Discharge status: Home / Self Care | Payer: BLUE CROSS/BLUE SHIELD | Source: Ambulatory Visit | Attending: Internal Medicine | Admitting: Internal Medicine

## 2016-04-15 DIAGNOSIS — N503 Cyst of epididymis: Secondary | ICD-10-CM | POA: Diagnosis not present

## 2016-04-15 DIAGNOSIS — N5089 Other specified disorders of the male genital organs: Secondary | ICD-10-CM

## 2016-04-22 ENCOUNTER — Ambulatory Visit: Payer: BLUE CROSS/BLUE SHIELD

## 2016-06-03 ENCOUNTER — Ambulatory Visit: Payer: BLUE CROSS/BLUE SHIELD

## 2016-06-07 ENCOUNTER — Ambulatory Visit (HOSPITAL_COMMUNITY)
Admission: EM | Admit: 2016-06-07 | Discharge: 2016-06-07 | Disposition: A | Discharge status: Home / Self Care | Payer: BLUE CROSS/BLUE SHIELD

## 2016-06-13 ENCOUNTER — Encounter (HOSPITAL_COMMUNITY): Payer: Self-pay | Admitting: Emergency Medicine

## 2016-06-13 ENCOUNTER — Ambulatory Visit (HOSPITAL_COMMUNITY)
Admission: EM | Admit: 2016-06-13 | Discharge: 2016-06-13 | Disposition: A | Discharge status: Home / Self Care | Payer: BLUE CROSS/BLUE SHIELD | Attending: Family Medicine | Admitting: Family Medicine

## 2016-06-13 DIAGNOSIS — R059 Cough, unspecified: Secondary | ICD-10-CM

## 2016-06-13 DIAGNOSIS — R05 Cough: Secondary | ICD-10-CM | POA: Diagnosis not present

## 2016-06-13 MED ORDER — METHYLPREDNISOLONE 4 MG PO TBPK: ORAL_TABLET | ORAL | 0 refills | Status: DC

## 2016-06-13 MED ORDER — HYDROCOD POLST-CPM POLST ER 10-8 MG/5ML PO SUER: 5.0000 mL | Freq: Every evening | ORAL | 0 refills | Status: DC | PRN

## 2016-06-13 NOTE — ED Triage Notes (Signed)
Patient has had a cough for months.  Soreness in upper back, pain in lower back.  Patient is a Corporate investment bankerconstruction worker.

## 2016-06-13 NOTE — ED Provider Notes (Signed)
[facility]    CSN: [id number] Arrival date & time: 06/13/16  4098  First Provider Contact:  First MD Initiated Contact with Patient 06/13/16 2057        History   Chief Complaint Chief Complaint  Patient presents with  . Cough    HPI Samuel Dennis is a 45 y.o. male.   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 He says the cough is dry and is not associated with any shortness of breath. He's had no wheezing and no fever.  The cough began after an acute respiratory illness. He was given antibiotics at the time but the cough never resolved. When it began, the cough is associated with phlegm production but now there is none.  Patient denies any postnasal drainage or nasal congestion. He's never had asthma. He has no symptoms of reflux such as substernal burning or awakening at night with reflux.  He says the cough can happen day or night but usually during the day. He notes that he does not sleep well in general.  Patient notes some back soreness and some right hip soreness which she thinks comes from coughing so much.      Past Medical History:  Diagnosis Date  . Depression   . Neuromuscular disorder (HCC)     There are no active problems to display for this patient.   History reviewed. No pertinent surgical history.     Home Medications    Prior to Admission medications   Medication Sig Start Date End Date Taking? Authorizing Provider  benzonatate (TESSALON) 100 MG capsule Take 1 capsule (100 mg total) by mouth 2 (two) times daily as needed for cough. 03/26/16   Tonye Pearson, MD  HYDROcodone-homatropine [facility]) 5-1.5 MG/5ML syrup Take 5 mLs by mouth every 6 (six) hours as needed. 03/26/16   Tonye Pearson, MD  predniSONE (DELTASONE) 20 MG tablet 3/3/2/2/1/1 single daily dose for 6 days 03/26/16   Tonye Pearson, MD    Family History Family History  Problem Relation Age of  Onset  . Diabetes Mother     Social History Social History  Substance Use Topics  . Smoking status: Never Smoker  . Smokeless tobacco: Never Used  . Alcohol use No     Allergies   Review of patient's allergies indicates no known allergies.   Review of Systems Review of Systems  Constitutional: Negative.   HENT: Negative.   Eyes: Negative.   Respiratory: Positive for cough.   Cardiovascular: Negative.   Gastrointestinal: Negative.   Musculoskeletal: Positive for back pain.     Physical Exam Triage Vital Signs ED Triage Vitals [06/13/16 2039]  Enc Vitals Group     BP      Pulse      Resp      Temp      Temp src      SpO2      Weight      Height      Head Circumference      Peak Flow      Pain Score 0     Pain Loc      Pain Edu?      Excl. in GC?    No data found.   Updated Vital Signs There were no vitals taken for this visit.     Physical Exam  Constitutional: He is oriented to person, place, and time. He appears well-developed and well-nourished.  HENT:  Head: Normocephalic.  Right Ear: External ear normal.  Left Ear: External ear normal.  Nose: Nose normal.  Mouth/Throat: Oropharynx is clear and moist.  Eyes: Conjunctivae are normal. Pupils are equal, round, and reactive to light.  Neck: Normal range of motion. Neck supple.  Cardiovascular: Normal rate, regular rhythm and normal heart sounds.   Pulmonary/Chest: Effort normal and breath sounds normal.  Abdominal: Soft.  Musculoskeletal: Normal range of motion.  Neurological: He is alert and oriented to person, place, and time.  Skin: Skin is warm and dry.  Nursing note and vitals reviewed.    UC Treatments / Results  Labs (all labs ordered are listed, but only abnormal results are displayed) Labs Reviewed - No data to display  EKG  EKG Interpretation None       Radiology No results found.  Procedures Procedures (including critical care time)  Medications Ordered in  UC Medications - No data to display   Initial Impression / Assessment and Plan / UC Course  I have reviewed the triage vital signs and the nursing notes.  Pertinent labs & imaging results that were available during my care of the patient were reviewed by me and considered in my medical decision making (see chart for details).  Clinical Course      Final Clinical Impressions(s) / UC Diagnoses   Final diagnoses:  None    New Prescriptions New Prescriptions   No medications on file     Elvina SidleKurt Glenard Keesling, MD 06/13/16 2122

## 2016-08-08 ENCOUNTER — Ambulatory Visit (INDEPENDENT_AMBULATORY_CARE_PROVIDER_SITE_OTHER): Payer: BLUE CROSS/BLUE SHIELD | Admitting: Urgent Care

## 2016-08-08 VITALS — BP 124/76 | HR 86 | Temp 97.8°F | Resp 16 | Ht 67.0 in | Wt 173.8 lb

## 2016-08-08 DIAGNOSIS — R140 Abdominal distension (gaseous): Secondary | ICD-10-CM | POA: Diagnosis not present

## 2016-08-08 DIAGNOSIS — R1011 Right upper quadrant pain: Secondary | ICD-10-CM | POA: Diagnosis not present

## 2016-08-08 DIAGNOSIS — R142 Eructation: Secondary | ICD-10-CM

## 2016-08-08 LAB — COMPREHENSIVE METABOLIC PANEL
ALK PHOS: 48 U/L (ref 40–115)
ALT: 31 U/L (ref 9–46)
AST: 23 U/L (ref 10–40)
Albumin: 4.5 g/dL (ref 3.6–5.1)
BILIRUBIN TOTAL: 0.5 mg/dL (ref 0.2–1.2)
BUN: 20 mg/dL (ref 7–25)
CALCIUM: 9.2 mg/dL (ref 8.6–10.3)
CO2: 31 mmol/L (ref 20–31)
Chloride: 103 mmol/L (ref 98–110)
Creat: 0.77 mg/dL (ref 0.60–1.35)
GLUCOSE: 92 mg/dL (ref 65–99)
Potassium: 4.4 mmol/L (ref 3.5–5.3)
Sodium: 140 mmol/L (ref 135–146)
TOTAL PROTEIN: 7.1 g/dL (ref 6.1–8.1)

## 2016-08-08 MED ORDER — RANITIDINE HCL 150 MG PO TABS: 150.0000 mg | ORAL_TABLET | Freq: Two times a day (BID) | ORAL | 0 refills | Status: DC

## 2016-08-08 MED ORDER — OMEPRAZOLE 20 MG PO CPDR: 20.0000 mg | DELAYED_RELEASE_CAPSULE | Freq: Every day | ORAL | 3 refills | Status: DC

## 2016-08-08 NOTE — Patient Instructions (Addendum)
Opciones de alimentos para pacientes con reflujo gastroesofgico - Adultos (Food Choices for Gastroesophageal Reflux Disease, Adult) Cuando se tiene reflujo gastroesofgico (ERGE), los alimentos que se ingieren y los hbitos de alimentacin son muy importantes. Elegir los alimentos adecuados puede ayudar a Paramedicaliviar las molestias ocasionadas por el [place]villeERGE. QU PAUTAS GENERALES DEBO SEGUIR?  Elija las frutas, los vegetales, los cereales integrales, los productos lcteos, la carne de [place], de pescado y de ave con bajo contenido de grasas.  Limite las grasas, [address]como los [place] Junta Gardensaceites, los aderezos para [place]ada, la Bel[place]manteca, los frutos secos y Programme researcher, broadcasting/film/videoel aguacate.  Lleve un registro de las comidas para identificar los alimentos que ocasionan sntomas.  Evite los alimentos que le ocasionen reflujo. Pueden ser distintos para cada persona.  Haga comidas pequeas con frecuencia en lugar de tres comidas OfficeMax Incorporatedabundantes todos los das.  Coma lentamente, en un clima distendido.  Limite el consumo de alimentos fritos.  Cocine los alimentos utilizando mtodos que no sean la fritura.  Evite el consumo alcohol.  Evite beber grandes cantidades de lquidos con las comidas.  Evite agacharse o recostarse hasta despus de 2 o 3horas de haber comido. QU ALIMENTOS NO SE RECOMIENDAN? Los siguientes son algunos alimentos y bebidas que pueden empeorar los sntomas: Garment/textile technologistVegetales  Tomates. Jugo de tomate. Salsa de tomate y espagueti. Ajes. Cebolla y Burleyajo. Rbano picante. Frutas  Naranjas, pomelos y limn (fruta y Sloveniajugo). Carnes  Carnes de [place]vaca, de pescado y de ave con gran contenido de grasas. Esto incluye los perros calientes, las Oak Grovecostillas, el Sherrilljamn, la salchicha, el salame y el tocino. Lcteos  Leche entera y Petoskeyleche chocolatada. PPG IndustriesCrema cida. Crema. Mantequilla. Helados. Queso crema. Bebidas  Caf y t negro, con o sin cafena Bebidas gaseosas o energizantes. Condimentos  Salsa picante. Salsa barbacoa. Dulces/postres   Chocolate y cacao. Rosquillas. Menta y mentol. Grasas y Wells Fargoaceites  Alimentos con alto contenido de grasas, incluidas las papas fritas. Otros  Vinagre. Especias picantes, como la Brink's Companypimienta negra, la pimienta blanca, la pimienta roja, la pimienta de cayena, el curry en Franklintownpolvo, los clavos de Meadolor, el jengibre y el [place]ub[place]ile en polvo. Los artculos mencionados arriba pueden no ser Raytheonuna lista completa de las bebidas y los alimentos que se Theatre stage managerdeben evitar. Comunquese con el nutricionista para recibir ms informacin.  Esta informacin no tiene Theme park managercomo fin reemplazar el consejo del mdico. Asegrese de hacerle al mdico cualquier pregunta que tenga. Document Released: 06/15/2005 Document Revised: 09/26/2014 Document Reviewed: 07/10/2013 Elsevier Interactive Patient Education  2017 [employer]vin[employer]Elsevier Inc.     IF you received an x-ray today, you will receive an invoice from [facility]. Please contact [facility] at [phone] with questions or concerns regarding your invoice.   IF you received labwork today, you will receive an invoice from [facility]. Please contact [facility] at [phone] with questions or concerns regarding your invoice.   Our billing staff will not be able to assist you with questions regarding bills from these companies.  You will be contacted with the lab results as soon as they are available. The fastest way to get your results is to activate your My Chart account. Instructions are located on the last page of this paperwork. If you have not heard from us regarding the results in 2 weeks, please contact this office.

## 2016-08-08 NOTE — Progress Notes (Signed)
    MRN: [id number] DOB: March 15, 1971  Subjective:   Samuel Dennis is a 45 y.o. male presenting for chief complaint of Shoulder Pain (pain on right side, feels bloated)  Reports nightly bloating, heartburn for the past several weeks. He states that it makes it difficult to sleep at night, has trouble laying down flat. He is worried that could be his gall bladder, states that he occasionally gets RUQ abdominal pain that radiates to his shoulder after eating fried food. He looked this up online and would like to make sure he does not have a problem with his gallbladder. Admits that he eats unhealthy food, spicy food. He does not have this problem when he eats healthy, salads and such. Denies fever, vomiting, constipation, weight loss, decreased appetite. Denies smoking cigarettes or drinking alcohol.   Prynce currently has no medications in their medication list. Also has No Known Allergies.  Montario  has a past medical history of Depression and Neuromuscular disorder (HCC). Also  has no past surgical history on file.   Objective:   Vitals: BP 124/76 (BP Location: Right Arm, Patient Position: Sitting, Cuff Size: Small)   Pulse 86   Temp 97.8 F (36.6 C) (Oral)   Resp 16   Ht 5\' 7"  (1.702 m)   Wt 173 lb 12.8 oz (78.8 kg)   SpO2 97%   BMI 27.22 kg/m   Physical Exam  Constitutional: He is oriented to person, place, and time. He appears well-developed and well-nourished.  HENT:  Mouth/Throat: Oropharynx is clear and moist.  Eyes: No scleral icterus.  Cardiovascular: Normal rate, regular rhythm and intact distal pulses.  Exam reveals no gallop and no friction rub.   No murmur heard. Pulmonary/Chest: No respiratory distress. He has no wheezes. He has no rales.  Neurological: He is alert and oriented to person, place, and time.  Skin: Skin is warm and dry.   Assessment and Plan :   1. Abdominal pain, right upper quadrant 2. Abdominal bloating 3. Burping - Will manage his  symptoms as GERD. Start Prilosec, Zantac. Labs pending, recheck in 4 weeks.  Wallis BambergMario Antara Brecheisen, PA-C [facility] [phone] 08/08/2016 3:14 PM

## 2016-08-12 ENCOUNTER — Encounter: Payer: Self-pay | Admitting: Urgent Care

## 2016-08-29 ENCOUNTER — Ambulatory Visit (INDEPENDENT_AMBULATORY_CARE_PROVIDER_SITE_OTHER): Payer: BLUE CROSS/BLUE SHIELD | Admitting: Urgent Care

## 2016-08-29 VITALS — BP 130/80 | HR 108 | Temp 98.4°F | Resp 17 | Ht 67.0 in | Wt 170.0 lb

## 2016-08-29 DIAGNOSIS — G4700 Insomnia, unspecified: Secondary | ICD-10-CM | POA: Diagnosis not present

## 2016-08-29 DIAGNOSIS — F419 Anxiety disorder, unspecified: Principal | ICD-10-CM

## 2016-08-29 DIAGNOSIS — F329 Major depressive disorder, single episode, unspecified: Secondary | ICD-10-CM

## 2016-08-29 DIAGNOSIS — F418 Other specified anxiety disorders: Secondary | ICD-10-CM

## 2016-08-29 DIAGNOSIS — F32A Depression, unspecified: Secondary | ICD-10-CM

## 2016-08-29 MED ORDER — CLONAZEPAM 0.5 MG PO TABS: 0.5000 mg | ORAL_TABLET | Freq: Two times a day (BID) | ORAL | 1 refills | Status: DC | PRN

## 2016-08-29 MED ORDER — SERTRALINE HCL 100 MG PO TABS: 100.0000 mg | ORAL_TABLET | Freq: Every day | ORAL | 3 refills | Status: DC

## 2016-08-29 MED ORDER — SERTRALINE HCL 50 MG PO TABS: 50.0000 mg | ORAL_TABLET | Freq: Every day | ORAL | 3 refills | Status: DC

## 2016-08-29 NOTE — Progress Notes (Signed)
    MRN: [id number] DOB: [birth year]  Subjective:   Samuel Dennis is a 45 y.o. male presenting for chief complaint of Depression (Per screening/ not sleeping well)  Reports several week history of difficulty sleeping. This is associated with worrying, stress with his 46 year old son. He is having a difficult time with him, managing stress of raising him. Admits having tenseness in his neck and back. Has a difficult time controlling his worrying. Reports good relationships with his wife. Has previously failed Prozac. Has used clonazepam and lorazepam in the past. Denies SI, HI.  Samuel Dennis has a current medication list which includes the following prescription(s): ibuprofen-diphenhydramine cit, omeprazole, and ranitidine. Also has No Known Allergies.  Samuel Dennis  has a past medical history of Depression and Neuromuscular disorder (HCC). Also  has no past surgical history on file.   Objective:   Vitals: BP 130/80 (BP Location: Right Arm, Patient Position: Sitting, Cuff Size: Normal)   Pulse (!) 108   Temp 98.4 F (36.9 C) (Oral)   Resp 17   Ht 5\' 7"  (1.702 m)   Wt 170 lb (77.1 kg)   SpO2 97%   BMI 26.63 kg/m   Physical Exam  Constitutional: He is oriented to person, place, and time. He appears well-developed and well-nourished.  Cardiovascular: Normal rate.   Pulmonary/Chest: Effort normal.  Neurological: He is alert and oriented to person, place, and time.  Psychiatric: His mood appears anxious. His affect is not blunt and not labile. His speech is not rapid and/or pressured, not delayed, not tangential and not slurred. He does not exhibit a depressed mood. He expresses no homicidal and no suicidal ideation. He is communicative.   Assessment and Plan :   1. Anxiety and depression 2. Insomnia, unspecified type - Start Zoloft, titrate up to 150mg  over 6 weeks. Recheck at that point. In the meantime use Klonopin for sleep issues. Discussed potential for adverse effects.   Wallis BambergMario  Rosaland Shiffman, PA-C [facility] [phone] 08/29/2016 6:35 PM

## 2016-08-29 NOTE — Patient Instructions (Addendum)
Zoloft Semana 1-2: Tome una pastilla de 50mg  diaramente. Semana 3-4: Tome una pastilla de 100mg  diaramente. Semana 5-6: Tome una de 50mg  y Neomia Dearuna 100mg  diaramente.    Sertraline tablets Qu es este medicamento? La SERTRALINA se utiliza para tratar la depresin. Este medicamento tambin puede ayudar a Dealerpersonas con trastorno obsesivo-compulsivo, trastorno de pnico, estrs postraumtico, trastorno disfrico premenstrual (TDPM) o ansiedad social. NorrisEste medicamento puede ser utilizado para otros usos; si tiene alguna pregunta consulte con su proveedor de atencin mdica o con su farmacutico. MARCAS COMUNES: Zoloft Qu le debo informar a mi profesional de la salud antes de tomar este medicamento? Necesitan saber si usted presenta alguno de los siguientes problemas o situaciones: trastornos de sangrado trastorno bipolar o antecedentes familiares de trastorno bipolar glaucoma enfermedad cardiaca presin sangunea alta antecedentes de ritmo cardiaco irregular antecedentes de bajos niveles de calcio, magnesio o potasio en la sangre si bebe alcohol con frecuencia enfermedad heptica si recibi terapia electroconvulsiva convulsiones ideas, planes o intento de suicidio; si usted o alguien de su familia ha intentado suicidarse previamente si toma medicamentos que tratan o previenen cogulos sanguneos enfermedad tiroidea una reaccin alrgica o inusual a la sertralina, a otros medicamentos, alimentos, colorantes o conservantes si est embarazada o buscando quedar embarazada si est amamantando a un beb Cmo debo utilizar este medicamento? Tome este medicamento por va oral con un vaso de agua. Siga las instrucciones de la etiqueta del Fruithurstmedicamento. Este medicamento se puede tomar con o sin alimentos. Tome sus dosis a intervalos regulares. No tome su medicamento con una frecuencia mayor a la indicada. No deje de tomar PPL Corporationeste medicamento repentinamente a menos que as indique su mdico. El detener este medicamento  demasiado rpido puede causar efectos secundarios graves o puede empeorar su condicin. Su farmacutico le dar una gua del medicamento especial con cada receta y relleno. Asegrese de leer esta informacin cada vez cuidadosamente. Hable con su pediatra para informarse acerca del uso de este medicamento en nios. Aunque este medicamento puede ser recetado a nios tan menores como de 7 aos de edad para condiciones selectivas, las precauciones se aplican. Sobredosis: Pngase en contacto inmediatamente con un centro toxicolgico o una sala de urgencia si usted cree que haya tomado demasiado medicamento. ATENCIN: Reynolds AmericanEste medicamento es solo para usted. No comparta este medicamento con nadie. Qu sucede si me olvido de una dosis? Si olvida una dosis, tmela lo antes posible. Si es casi la hora de la prxima dosis, tome slo esa dosis. No tome dosis adicionales o dobles. Qu puede interactuar con este medicamento? No tome esta medicina con ninguno de los siguientes medicamentos: -ciertos medicamentos para infecciones micticas, tales como fluconazol, itraconazol, quetoconazol, posaconazol, voriconazol -cisapride -disulfiram -dofetilida -linezolid -IMAOs, tales como Carbex, Eldepryl, Marplan, Nardil y Parnate -metronidazol -azul de metileno (va intravenosa) -pimozida -tioridazina -ziprasidona Esta medicina tambin puede interactuar con los siguientes medicamentos: -alcohol -aspirina o medicamentos tipo aspirina -ciertos medicamentos para la depresin, ansiedad o trastornos psicticos -ciertos medicamentos para el pulso cardiaco irregular, tales como flecainida, propafenona -ciertos medicamentos para migraas, tales como almotriptn, eletriptn, frovatriptn, naratriptn, rizatriptn, sumatriptn, zolmitriptn -ciertos medicamentos para conciliar el sueo -ciertos medicamentos para convulsiones, tales como carbamazepina, cido valproico, fenitona -ciertos medicamentos que tratan o previenen  cogulos sanguneos, tales como warfarina, enoxaparina, dalteparina -cimetidina -digoxina -diurticos -fentanilo -furazolidona -isoniazida -litio -los AINEs, medicamentos para el dolor o inflamacin, tales como ibuprofeno o naproxeno -otros medicamentos que prolongan el intervalo QT (causa un ritmo cardiaco anormal) -procarbazina -rasagilina -suplementos como hierba de [place], kava  kava, valeriana -tolbutamida -tramadol -triptfano Puede ser que esta lista no menciona todas las posibles interacciones. Informe a su profesional de [employer] de [employer] productos a base de hierbas, medicamentos de [place] o suplementos nutritivos que est tomando. Si usted fuma, consume bebidas alcohlicas o si utiliza drogas ilegales, indqueselo tambin a su profesional de [employer]. Algunas sustancias pueden interactuar con su medicamento. A qu debo estar atento al usar [employer] Corporation? Informe a su mdico si sus sntomas no mejoran o si empeoran. Visite a su mdico o a su profesional de la salud para chequear su evolucin peridicamente. Debido que puede ser necesario tomar este medicamento durante varias semanas para que sea posible observar sus efectos en forma [place], es importante que sigue su tratamiento como recetado por su mdico. Los pacientes y sus familias deben estar atentos si empeora la depresin o ideas suicidas. Tambin est atento a cambios repentinos o severos de [employer] sentirse ansioso, agitado, lleno de pnico, irritable, hostil, agresivo, impulsivo, inquietud severa, demasiado excitado y hiperactivo o dificultad para conciliar el sueo. Si esto ocurre, especialmente al comenzar con un tratamiento antidepresivo o al cambiar de dosis, comunquese con su profesional de [employer]. Puede experimentar somnolencia o mareos. No conduzca ni utilice maquinaria, ni haga nada que Scientist, research (life sciences) en estado de alerta hasta que sepa cmo le afecta este medicamento. No se siente ni se  ponga de pie con rapidez, especialmente si es un paciente de edad avanzada. Esto reduce el riesgo de mareos o [employer]. El alcohol puede interferir con el efecto del medicamento. Evite consumir bebidas alcohlicas. Se le podr secar la boca. Masticar chicle sin azcar, chupar caramelos duros y tomar agua en abundancia le ayudar a mantener la boca hmeda. Si el problema no desaparece o es severo, consulte a su mdico. Qu efectos secundarios puedo tener al [employer] este medicamento? Efectos secundarios que debe informar a su mdico o a Producer, television/film/video de la salud tan pronto como sea posible: Therapist, art, como erupcin cutnea, comezn/picazn o urticarias, e hinchazn de la cara, los labios o la lengua ansiedad heces de color negro y aspecto alquitranado cambios en la visin confusin estado de nimo elevado, menor necesidad de dormir, pensamientos acelerados, conducta impulsiva dolor ocular ritmo cardiaco rpido, irregular sensacin de desmayos o aturdimiento, cadas sensacin de agitacin, enojo o irritabilidad alucinaciones, prdida del contacto con la realidad prdida de equilibrio o coordinacin prdida de memoria ereccin dolorosa o prolongada inquietud, caminar de un lado a otro, incapacidad para quedarse quieto convulsiones rigidez de los [employer] Corporation ideas suicidas u otros cambios en el estado de nimo dificultad para conciliar el sueo sangrado o moretones inusuales cansancio o debilidad inusual vmito Efectos secundarios que generalmente no requieren atencin mdica (infrmelos a su mdico o a Producer, television/film/video de la salud si persisten o si son molestos): cambios en el apetito o el peso cambios en el deseo o desempeo sexual diarrea aumento de la sudoracin indigestin, nuseas temblores Puede ser que esta lista no menciona todos los posibles efectos secundarios. Comunquese a su mdico por asesoramiento mdico [employer]. Usted puede informar los efectos secundarios a la FDA  por telfono al 1-800-FDA-1088. Dnde debo guardar mi medicina? Mantngala fuera del alcance de los nios. Gurdela a [employer], entre 15 y 30 grados C (72 y 35 grados F). Deseche todo el medicamento que no haya utilizado, despus de la fecha de vencimiento. ATENCIN: Este folleto es un resumen. Puede ser que no [place] toda la  posible informacin. Si usted tiene preguntas acerca de esta medicina, consulte con su mdico, su farmacutico o su profesional de Radiographer, therapeutic.  2017 Elsevier/Gold Standard (2016-04-15 00:00:00)    Clonazepam tablets What is this medicine? CLONAZEPAM (kloe NA ze pam) is a benzodiazepine. It is used to treat certain types of seizures. It is also used to treat panic disorder. This medicine may be used for other purposes; ask your health care provider or pharmacist if you have questions. COMMON BRAND NAME(S): Ceberclon, Klonopin What should I tell my health care provider before I take this medicine? They need to know if you have any of these conditions: -an alcohol or drug abuse problem -bipolar disorder, depression, psychosis or other mental health condition -glaucoma -kidney or liver disease -lung or breathing disease -myasthenia gravis -Parkinson's disease -porphyria -seizures or a history of seizures -suicidal thoughts -an unusual or allergic reaction to clonazepam, other benzodiazepines, foods, dyes, or preservatives -pregnant or trying to get pregnant -breast-feeding How should I use this medicine? Take this medicine by mouth with a glass of water. Follow the directions on the prescription label. If it upsets your stomach, take it with food or milk. Take your medicine at regular intervals. Do not take it more often than directed. Do not stop taking or change the dose except on the advice of your doctor or health care professional. A special MedGuide will be given to you by the pharmacist with each prescription and refill. Be sure to read this  information carefully each time. Talk to your pediatrician regarding the use of this medicine in children. Special care may be needed. Overdosage: If you think you have taken too much of this medicine contact a poison control center or emergency room at once. NOTE: This medicine is only for you. Do not share this medicine with others. What if I miss a dose? If you miss a dose, take it as soon as you can. If it is almost time for your next dose, take only that dose. Do not take double or extra doses. What may interact with this medicine? Do not take this medication with any of the following medicines: -narcotic medicines for cough -sodium oxybate This medicine may also interact with the following medications: -alcohol -antihistamines for allergy, cough and cold -antiviral medicines for HIV or AIDS -certain medicines for anxiety or sleep -certain medicines for depression, like amitriptyline, fluoxetine, sertraline -certain medicines for fungal infections like ketoconazole and itraconazole -certain medicines for seizures like carbamazepine, phenobarbital, phenytoin, primidone -general anesthetics like halothane, isoflurane, methoxyflurane, propofol -local anesthetics like lidocaine, pramoxine, tetracaine -medicines that relax muscles for surgery -narcotic medicines for pain -phenothiazines like chlorpromazine, mesoridazine, prochlorperazine, thioridazine This list may not describe all possible interactions. Give your health care provider a list of all the medicines, herbs, non-prescription drugs, or dietary supplements you use. Also tell them if you smoke, drink alcohol, or use illegal drugs. Some items may interact with your medicine. What should I watch for while using this medicine? Tell your doctor or health care professional if your symptoms do not start to get better or if they get worse. Do not stop taking except on your doctor's advice. You may develop a severe reaction. Your doctor  will tell you how much medicine to take. You may get drowsy or dizzy. Do not drive, use machinery, or do anything that needs mental alertness until you know how this medicine affects you. To reduce the risk of dizzy and fainting spells, do not stand or sit up  quickly, especially if you are an older patient. Alcohol may increase dizziness and drowsiness. Avoid alcoholic drinks. If you are taking another medicine that also causes drowsiness, you may have more side effects. Give your health care provider a list of all medicines you use. Your doctor will tell you how much medicine to take. Do not take more medicine than directed. Call emergency for help if you have problems breathing or unusual sleepiness. The use of this medicine may increase the chance of suicidal thoughts or actions. Pay special attention to how you are responding while on this medicine. Any worsening of mood, or thoughts of suicide or dying should be reported to your health care professional right away. What side effects may I notice from receiving this medicine? Side effects that you should report to your doctor or health care professional as soon as possible: -allergic reactions like skin rash, itching or hives, swelling of the face, lips, or tongue -breathing problems -confusion -loss of balance or coordination -signs and symptoms of low blood pressure like dizziness; feeling faint or lightheaded, falls; unusually weak or tired -suicidal thoughts or mood changes Side effects that usually do not require medical attention (report to your doctor or health care professional if they continue or are bothersome): -dizziness -headache -tiredness -upset stomach This list may not describe all possible side effects. Call your doctor for medical advice about side effects. You may report side effects to FDA at 1-800-FDA-1088. Where should I keep my medicine? Keep out of the reach of children. This medicine can be abused. Keep your medicine  in a safe place to protect it from theft. Do not share this medicine with anyone. Selling or giving away this medicine is dangerous and against the law. This medicine may cause accidental overdose and death if taken by other adults, children, or pets. Mix any unused medicine with a substance like cat litter or coffee grounds. Then throw the medicine away in a sealed container like a sealed bag or a coffee can with a lid. Do not use the medicine after the expiration date. Store at room temperature between 15 and 30 degrees C (59 and 86 degrees F). Protect from light. Keep container tightly closed. NOTE: This sheet is a summary. It may not cover all possible information. If you have questions about this medicine, talk to your doctor, pharmacist, or health care provider.  2017 Elsevier/Gold Standard (2016-02-12 18:46:32)       IF you received an x-ray today, you will receive an invoice from [facility]. Please contact [facility] at [phone] with questions or concerns regarding your invoice.   IF you received labwork today, you will receive an invoice from [facility]. Please contact [facility] at [phone] with questions or concerns regarding your invoice.   Our billing staff will not be able to assist you with questions regarding bills from these companies.  You will be contacted with the lab results as soon as they are available. The fastest way to get your results is to activate your My Chart account. Instructions are located on the last page of this paperwork. If you have not heard from us regarding the results in 2 weeks, please contact this office.

## 2016-09-08 ENCOUNTER — Ambulatory Visit: Payer: BLUE CROSS/BLUE SHIELD | Admitting: Urgent Care

## 2016-11-18 ENCOUNTER — Ambulatory Visit (INDEPENDENT_AMBULATORY_CARE_PROVIDER_SITE_OTHER): Payer: BLUE CROSS/BLUE SHIELD

## 2016-11-18 ENCOUNTER — Ambulatory Visit (INDEPENDENT_AMBULATORY_CARE_PROVIDER_SITE_OTHER): Payer: BLUE CROSS/BLUE SHIELD | Admitting: Urgent Care

## 2016-11-18 VITALS — BP 110/70 | HR 77 | Temp 97.7°F | Resp 16 | Ht 67.0 in | Wt 166.0 lb

## 2016-11-18 DIAGNOSIS — M25511 Pain in right shoulder: Secondary | ICD-10-CM | POA: Diagnosis not present

## 2016-11-18 DIAGNOSIS — M2550 Pain in unspecified joint: Secondary | ICD-10-CM

## 2016-11-18 MED ORDER — CYCLOBENZAPRINE HCL 5 MG PO TABS: 5.0000 mg | ORAL_TABLET | Freq: Three times a day (TID) | ORAL | 5 refills | Status: DC | PRN

## 2016-11-18 MED ORDER — NAPROXEN SODIUM 550 MG PO TABS: 550.0000 mg | ORAL_TABLET | Freq: Two times a day (BID) | ORAL | 1 refills | Status: DC

## 2016-11-18 NOTE — Patient Instructions (Addendum)
Chondroitin glucosamine   Dolor en el hombro (Shoulder Pain) Muchas cosas pueden provocar dolor en el hombro, por ejemplo:  Una lesin en la zona.  El uso excesivo del hombro.  Artritis. La causa del dolor puede ser lo siguiente:  Inflamacin.  Una lesin en la articulacin del hombro.  Una lesin en un tendn, ligamento o hueso. INSTRUCCIONES PARA EL CUIDADO EN EL HOGAR Tome estas medidas para aliviar el dolor:  Apriete una pelota blanda o una almohadilla de goma tanto como sea posible. Esto ayuda e prevenir la hinchazn en el hombro. Tambin ayuda a Medical sales representativefortalecer el brazo.  Tome los medicamentos de venta libre y los recetados solamente como se lo haya indicado el mdico.  Si se lo indican, aplique hielo sobre la zona:  Ponga el hielo en una bolsa plstica.  Coloque una toalla entre la piel y la bolsa de hielo.  Coloque el hielo durante 20minutos, 2 a 3veces por Futures traderda. Deje de aplicar hielo si no ayuda a Engineer, materialsaliviar el dolor.  Si le indicaron que use un cabestrillo o un inmovilizador en el hombro:  selos como se lo hayan indicado.  Quteselos para ducharse o para baarse.  Mueva el brazo lo menos posible, pero mantenga la mano en movimiento para evitar la hinchazn. SOLICITE ATENCIN MDICA SI:  El dolor empeora.  El dolor no se alivia con los United Parcelmedicamentos.  Aparece un dolor nuevo en el brazo, la mano o los dedos. SOLICITE ATENCIN MDICA DE INMEDIATO SI:  El brazo, la mano o los dedos:  Hormiguean.  Se adormecen.  Se hinchan.  Duelen.  Se tornan de color blanco o azul. Esta informacin no tiene Theme park managercomo fin reemplazar el consejo del mdico. Asegrese de hacerle al mdico cualquier pregunta que tenga. Document Released: 06/15/2005 Document Revised: 12/28/2015 Document Reviewed: 12/29/2014 Elsevier Interactive Patient Education  2017 [employer]vier Inc.     IF you received an x-ray today, you will receive an invoice from [facility]. Please contact  [facility] at [phone] with questions or concerns regarding your invoice.   IF you received labwork today, you will receive an invoice from [facility]. Please contact [facility] at [phone] with questions or concerns regarding your invoice.   Our billing staff will not be able to assist you with questions regarding bills from these companies.  You will be contacted with the lab results as soon as they are available. The fastest way to get your results is to activate your My Chart account. Instructions are located on the last page of this paperwork. If you have not heard from us regarding the results in 2 weeks, please contact this office.

## 2016-11-18 NOTE — Progress Notes (Signed)
  MRN: [id number] DOB: 1970-09-23  Subjective:   Samuel Dennis is a 46 y.o. male presenting for chief complaint of Shoulder Pain (Left) and Back Pain  Reports 4 month history of persistent right shoulder pain. Pain is constant, achy in nature, sharp intermittently, worse in the morning and gets better throughout the day, has occasional warmth. He sometimes compensates by using his left arm to help him with his right arm/shoulder, can be associated with trapezius discomfort and stress headaches. Has tried Advil on occasion without any relief. Denies fever, redness, trauma, swelling, bruising, numbness or tingling. 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 Also reports occasional aches in his right hip and foot. Denies history of gout or arthritis.  Samuel Dennis is not currently taking any medications. Also has No Known Allergies.  Samuel Dennis  has a past medical history of Depression and Neuromuscular disorder (HCC). Also denies past surgical history.  Objective:   Vitals: BP 110/70   Pulse 77   Temp 97.7 F (36.5 C) (Oral)   Resp 16   Ht 5\' 7"  (1.702 m)   Wt 166 lb (75.3 kg)   SpO2 97%   BMI 26.00 kg/m   Physical Exam  Constitutional: He is oriented to person, place, and time. He appears well-developed and well-nourished.  Cardiovascular: Normal rate.   Pulmonary/Chest: Effort normal.  Musculoskeletal:       Right shoulder: He exhibits decreased range of motion (with ROM testing) and tenderness (positive Hawkin and Empty Beer Can tests). He exhibits no bony tenderness, no swelling, no effusion, no crepitus, no deformity, no spasm and normal strength.       Cervical back: He exhibits normal range of motion, no tenderness, no bony tenderness, no swelling, no edema, no deformity and no spasm.  Neurological: He is alert and oriented to person, place, and time.   Dg Shoulder Right  Result Date: 11/18/2016 CLINICAL DATA:  Chronic right shoulder pain without known  injury. EXAM: RIGHT SHOULDER - 2+ VIEW COMPARISON:  None. FINDINGS: There is no evidence of fracture or dislocation. There is no evidence of arthropathy or other focal bone abnormality. Soft tissues are unremarkable. IMPRESSION: Normal right shoulder. Electronically Signed   By: Lupita RaiderJames  Green Jr, M.D.   On: 11/18/2016 12:29   Assessment and Plan :   1. Pain in joint of right shoulder 2. Multiple joint pain - Labs pending. I suspect his shoulder and other joint pain is associated with the nature of his work. I will have patient work on strengthening his rotator cuff, use NSAID and muscle relaxant, modify the way he uses his shoulder at work. RTC in 1-2 weeks if no improvement.  Wallis BambergMario Nameer Summer, PA-C [facility] [phone] 11/18/2016  11:42 AM

## 2016-11-19 LAB — URIC ACID: Uric Acid: 5.7 mg/dL (ref 3.7–8.6)

## 2016-11-29 DIAGNOSIS — K080 Exfoliation of teeth due to systemic causes: Secondary | ICD-10-CM | POA: Diagnosis not present

## 2017-02-09 DIAGNOSIS — G8929 Other chronic pain: Secondary | ICD-10-CM | POA: Diagnosis not present

## 2017-02-09 DIAGNOSIS — M25511 Pain in right shoulder: Secondary | ICD-10-CM | POA: Diagnosis not present

## 2017-02-09 DIAGNOSIS — M7541 Impingement syndrome of right shoulder: Secondary | ICD-10-CM | POA: Diagnosis not present

## 2017-03-27 DIAGNOSIS — G8929 Other chronic pain: Secondary | ICD-10-CM | POA: Diagnosis not present

## 2017-03-27 DIAGNOSIS — M7541 Impingement syndrome of right shoulder: Secondary | ICD-10-CM | POA: Diagnosis not present

## 2017-03-27 DIAGNOSIS — M25511 Pain in right shoulder: Secondary | ICD-10-CM | POA: Diagnosis not present

## 2017-05-12 DIAGNOSIS — H1011 Acute atopic conjunctivitis, right eye: Secondary | ICD-10-CM | POA: Diagnosis not present

## 2017-05-12 DIAGNOSIS — H538 Other visual disturbances: Secondary | ICD-10-CM | POA: Diagnosis not present

## 2017-05-12 DIAGNOSIS — H11153 Pinguecula, bilateral: Secondary | ICD-10-CM | POA: Diagnosis not present

## 2017-09-13 ENCOUNTER — Ambulatory Visit: Payer: BLUE CROSS/BLUE SHIELD | Admitting: Urgent Care

## 2017-10-05 ENCOUNTER — Encounter: Payer: Self-pay | Admitting: Urgent Care

## 2017-10-05 ENCOUNTER — Ambulatory Visit: Payer: BLUE CROSS/BLUE SHIELD | Admitting: Urgent Care

## 2017-10-05 VITALS — BP 122/67 | HR 60 | Temp 97.8°F | Resp 16 | Ht 67.0 in | Wt 167.2 lb

## 2017-10-05 DIAGNOSIS — F419 Anxiety disorder, unspecified: Secondary | ICD-10-CM | POA: Diagnosis not present

## 2017-10-05 DIAGNOSIS — F329 Major depressive disorder, single episode, unspecified: Secondary | ICD-10-CM

## 2017-10-05 DIAGNOSIS — F32A Depression, unspecified: Secondary | ICD-10-CM

## 2017-10-05 DIAGNOSIS — Z638 Other specified problems related to primary support group: Secondary | ICD-10-CM | POA: Diagnosis not present

## 2017-10-05 MED ORDER — FLUOXETINE HCL 20 MG PO TABS: 20.0000 mg | ORAL_TABLET | Freq: Every day | ORAL | 3 refills | Status: DC

## 2017-10-05 MED ORDER — CLONAZEPAM 0.5 MG PO TABS: 0.5000 mg | ORAL_TABLET | Freq: Every evening | ORAL | 0 refills | Status: DC | PRN

## 2017-10-05 NOTE — Patient Instructions (Addendum)
Trastorno depresivo persistente en los adultos Persistent Depressive Disorder, Adult El trastorno depresivo persistente (TDP) es una enfermedad mental que causa sntomas de depresin leve durante 2aos o ms. Tambin se la conoce como depresin a largo plazo (crnica) o distimia. El TDP puede incluir episodios de depresin ms graves que se prolongan por alrededor de 2semanas (trastorno depresivo mayor o TDM). Puede afectar el modo de pensar, sentir y dormir. Es posible que esta afeccin pueda afectar sus relaciones personales. Tiene mayores probabilidades de enfermarse si padece TDP. Cules son las causas? Se desconoce la causa exacta de esta afeccin. El TDP es causado probablemente por una combinacin de factores, que podran incluir los siguientes:  Factores genticos. Son rasgos que se transmiten de los padres a los hijos.  Factores individuales. Su personalidad, comportamiento y Engineer, building services en que Autoliv pensamientos y sentimientos podran contribuir al desarrollo del TDP. Esto incluye rasgos de personalidad y comportamientos adquiridos de Economist.  Factores fsicos, tales como: ? Production manager regin del cerebro que controla las emociones. Esta zona del cerebro puede ser distinta a la de las personas que no tienen TDP. ? Enfermedades mdicas o psiquitricas a Air cabin crew (crnicas).  [employer]. Las experiencias traumticas o los grandes cambios de vida pueden desempear un papel importante en el desarrollo del TDP.  Qu incrementa el riesgo? Es ms probable que esta afeccin se manifieste en las mujeres. Los siguientes factores pueden hacer que usted sea propenso a Environmental education officer TDP:  Un historial familiar de depresin.  Niveles anormalmente bajos de ciertas sustancias qumicas en el cerebro.  Eventos traumticos en la infancia, especialmente el maltrato o la prdida de un padre.  Vivir muchas situaciones de estrs o sufrir estrs crnico, en especial debido a  experiencias de vida desagradables o prdidas.  Tener antecedentes de lo siguiente: ? Enfermedad fsica crnica. ? Otros trastornos Intel. ? Consumo de drogas.  Condiciones de vida deficientes.  Sufrir exclusin o discriminacin social a diario.  Cules son los signos o los sntomas? Los sntomas de Art therapist la mayor parte del da y pueden incluir lo siguiente:  Estar fatigado o falto de Engineer, drilling.  Comer demasiado o muy poco.  Dormir demasiado o muy poco.  Inquietud o Designer, jewellery.  Sentimientos de desesperanza .  Sentirse intil o culpable.  Ansiedad.  Dificultad para concentrarse o para tomar decisiones.  [place].  Tener una visin negativa del futuro.  Incapacidad para divertirse o Medical sales representative.  Aislamiento social.  Dolencias fsicas que no tienen motivo.  Irritabilidad.  Conducta agresiva o ira.  Cmo se diagnostica? Esta afeccin se puede diagnosticar en funcin de lo siguiente:  Sus sntomas.  Sus antecedentes mdicos, incluido su historial de salud mental. Esto podra incluir exmenes para evaluar su salud mental. Podra tener que responder preguntas sobre su estilo de vida, incluso si consume drogas o alcohol, y por cunto tiempo ha tenido sntomas de TDP.  Un examen fsico.  Anlisis de sangre para descartar otras afecciones.  Es posible que le diagnostiquen TDP si ha tenido depresin durante 2aos o ms tiempo, as como tambin otros sntomas de depresin. Cmo se trata? Los profesionales que por lo general tratan este trastorno pertenecen al rea de la salud mental, como psiclogos, psiquiatras y trabajadores sociales clnicos. Puede ser que necesite ms de un tipo de [place]. El tratamiento puede incluir lo siguiente:  Psicoterapia. Esto tambin se puede denominar apoyo psicolgico o asistencia psicolgica. Los tipos de psicoterapia incluyen lo siguiente: ? Luther Redo cognitivo  conductual (TCC). Este tipo de  terapia le ensea a Education administrator, pensamientos y comportamientos poco saludables, y a Teacher, music con pensamientos y acciones positivas. ? Terapia interpersonal (TI). Lo ayuda a mejorar la [employer]ar Corporation se relaciona y Building surveyor con los dems. ? Information systems manager. Este tratamiento incluye a los miembros de la familia.  Medicamentos para controlar la ansiedad y la depresin, o para ayudarlo a Wellsite geologist emociones y comportamientos.  Cambios de estilo de vida tales como: ? Limitar el consumo de alcohol y drogas. ? Hacer actividad fsica con regularidad. ? Dormir lo suficiente. ? Optar por alimentos saludables. ? Pasar ms tiempo al [employer].  Siga estas instrucciones en su casa: Actividad  Retome sus actividades normales como se lo haya indicado el mdico.  [place]arrison actividad fsica con frecuencia y pase tiempo al aire libre como se lo haya indicado el mdico. Instrucciones generales  [employer] medicamentos de venta libre y los recetados solamente como se lo haya indicado el mdico.  No beba alcohol. Si toma alcohol, limite el consumo a no ms de por da si es mujer y no est [place], y por da si es hombre. Una medida equivale a 12onzas de cerveza, 5onzas de vino o 1onzas de bebidas alcohlicas de alta graduacin. El alcohol puede inhibir el efecto de los antidepresivos. Hable con el mdico sobre el consumo de alcohol.  Siga una dieta saludable y duerma lo suficiente.  Encuentre actividades que disfrute y hgase el tiempo para Community education officer.  Considere la posibilidad de Advertising account planner en un grupo de apoyo. El mdico podra recomendarle un grupo de apoyo.  Concurra a todas las visitas de control como se lo haya indicado el mdico. Esto es importante. Dnde encontrar ms informacin: [employer] (The [employer] American on Mental Illness)  www.nami.org  [employer] de la Salud Mental de Surveyor, minerals. (U.S. [employer] of Mental Health)  http://www.maynard.net/  Lnea Telefnica Nacional para la Prevencin del Suicidio (National Suicide Prevention Lifeline)  1-800-273-TALK 708 393 9854). La asistencia es gratuita y est disponible las 24horas.  Comunquese con un mdico si:  Los sntomas empeoran.  Presenta nuevos sntomas.  Tiene problemas para dormir o [employer] cotidianas. Solicite ayuda de inmediato si:  Se autoagrede.  Tiene pensamientos serios acerca de lastimarse a usted mismo o daar a Economist.  Ve, oye, saborea, huele o siente cosas que no estn presentes (alucinaciones). Esta informacin no tiene Theme park manager el consejo del mdico. Asegrese de hacerle al mdico cualquier pregunta que tenga. Document Released: 08/22/2012 Document Revised: 12/13/2016 Document Reviewed: 03/19/2016 Elsevier Interactive Patient Education  2018 [employer].    Fluoxetine capsules or tablets (Depression/Mood Disorders) Qu es este medicamento? La FLUOXETINA pertenece a un grupo de medicamentos llamados inhibidores selectivos de la recaptacin de serotonina (ISRS). Ayuda a tratar problemas de estado de nimo, tales como depresin, trastorno obsesivo-compulsivo y trastorno de pnico. Tambin puede tratar ciertos trastornos de la alimentacin. Este medicamento puede ser utilizado para otros usos; si tiene alguna pregunta consulte con su proveedor de atencin mdica o con su farmacutico. MARCAS COMUNES: Prozac Qu le debo informar a mi profesional de la salud antes de tomar este medicamento? Necesitan saber si usted presenta alguno de los [employer] o situaciones: trastorno bipolar o antecedentes familiares de trastorno bipolar trastornos de sangrado glaucoma enfermedad cardiaca enfermedad heptica bajos niveles de sodio en la sangre convulsiones ideas, planes o intento de suicidio; si usted o alguien de su familia ha intentado suicidarse previamente  si toma IMAO, tales como  Carbex, Eldepryl, Marplan, Nardil y Parnate si toma medicamentos que tratan o previenen cogulos sanguneos enfermedad tiroidea una reaccin alrgica o inusual a la fluoxetina, a otros medicamentos, alimentos, colorantes o conservantes si est embarazada o buscando quedar embarazada si est amamantando a un beb Cmo debo utilizar este medicamento? Tome este medicamento por va oral con un vaso de agua. Siga las instrucciones de la etiqueta del Cascomedicamento. Puede tomar este medicamento con o sin alimentos. Tome su medicamento a intervalos regulares. No lo tome con una frecuencia mayor a la indicada. No deje de tomar PPL Corporationeste medicamento de repente a menos que as lo indique su mdico. Dejar de Visual merchandiserutilizar este medicamento demasiado rpido puede causar efectos secundarios graves o podra empeorar su afeccin. Su farmacutico le dar una Gua del medicamento especial (MedGuide, nombre en ingls) con cada receta y en cada ocasin que la vuelva a surtir. Asegrese de leer esta informacin cada vez cuidadosamente. Hable con su pediatra para informarse acerca del uso de este medicamento en nios. Aunque este medicamento se puede recetar a nios tan pequeos como de 7 aos de edad con ciertas afecciones, existen precauciones que deben tomarse. Sobredosis: Pngase en contacto inmediatamente con un centro toxicolgico o una sala de urgencia si usted cree que haya tomado demasiado medicamento. ATENCIN: Reynolds AmericanEste medicamento es solo para usted. No comparta este medicamento con nadie. Qu sucede si me olvido de una dosis? Si olvida una dosis, sltese la dosis Monacoolvidada y vuelva al horario habitual de sus dosis. No tome dosis adicionales o dobles. Qu puede interactuar con este medicamento? No tome este medicamento con ninguno de los siguientes frmacos: otros medicamentos que contengan fluoxetina, tales como [place] o Symbyax cisaprida linezolida IMAO, tales como Courtenayarbex, Eldepryl, Marplan, Nardil y Parnate azul de metileno  (inyectado en una vena) pimozida tioridazina Este medicamento tambin puede Product/process development scientistinteractuar con los siguientes medicamentos: alcohol anfetaminas aspirina y medicamentos tipo aspirina carbamazepina ciertos medicamentos para la depresin, ansiedad o trastornos psicticos ciertos medicamentos para la migraa, tales como almotriptn, eletriptn, frovatriptn, naratriptn, rizatriptn, sumatriptn y zolmitriptn digoxina diurticos fentanilo flecainida furazolidona isoniazida litio medicamentos para conciliar el sueo medicamentos que tratan o previenen cogulos sanguneos, como warfarina, enoxaparina y dalteparina AmherstAINE, medicamentos para Chief Technology Officerel dolor y la inflamacin, como ibuprofeno o naproxeno fenitona procarbazina propafenona rasagilina ritonavir suplementos tales como hierba de [place] Base HousingSan [place], kava kava y valeriana tramadol triptfano vinblastina Puede ser que esta lista no menciona todas las posibles interacciones. Informe a su profesional de Beazer Homesla salud de Ingram Micro Inctodos los productos a base de hierbas, medicamentos de Rooseveltventa libre o suplementos nutritivos que est tomando. Si usted fuma, consume bebidas alcohlicas o si utiliza drogas ilegales, indqueselo tambin a su profesional de Beazer Homesla salud. Algunas sustancias pueden interactuar con su medicamento. A qu debo estar atento al usar PPL Corporationeste medicamento? Informe a su mdico si sus sntomas no mejoran o si empeoran. Visite a su mdico o a su profesional de la salud para chequear su evolucin peridicamente. Debido que puede ser necesario tomar este medicamento durante varias semanas para que sea posible observar sus efectos en forma I[place] Grovecompleta, es importante que sigue su tratamiento como recetado por su mdico. Los pacientes y sus familias deben estar atentos si empeora la depresin o ideas suicidas. Tambin est atento a cambios repentinos o severos de emocin, tales como el sentirse ansioso, agitado, lleno de pnico, irritable, hostil, agresivo, impulsivo, inquietud severa, demasiado  excitado y hiperactivo o dificultad para conciliar el sueo. Si esto ocurre, especialmente al comenzar con  el tratamiento o al cambiar de dosis, comunquese con su mdico. Puede experimentar somnolencia o [employer]. No conduzca ni utilice maquinaria, ni haga nada que Scientist, research (life sciences) en estado de alerta hasta que sepa cmo le afecta este medicamento. No se siente ni se ponga de pie con rapidez, especialmente si es un paciente de edad avanzada. Esto reduce el riesgo de mareos o [employer]. El alcohol puede interferir con el efecto de [place]. Evite consumir bebidas alcohlicas. Se le podr secar la boca. Masticar chicle sin azcar, chupar caramelos duros y tomar agua en abundancia le ayudar a mantener la boca hmeda. Si el problema no desaparece o es severo, consulte a su mdico. Este medicamento puede afectar sus niveles de Banker. Si tiene diabetes, consulte con su mdico o profesional de la salud antes de cambiar su dieta o la dosis de su medicamento para la diabetes. Qu efectos secundarios puedo tener al [employer] este medicamento? Efectos secundarios que debe informar a su mdico o a Producer, television/film/video de la salud tan pronto como sea posible: Therapist, art, como erupcin cutnea, comezn/picazn o urticarias, e hinchazn de la cara, los labios o la lengua ansiedad heces de color negro y aspecto alquitranado problemas respiratorios cambios en la visin confusin estado de nimo elevado, menor necesidad de dormir, pensamientos acelerados, conducta impulsiva dolor ocular ritmo cardiaco rpido, irregular sensacin de desmayos o aturdimiento, cadas sensacin de agitacin, enojo o irritabilidad alucinaciones, prdida del contacto con la realidad prdida de equilibrio o coordinacin prdida de memoria ereccin dolorosa o prolongada inquietud, caminar de un lado a otro, incapacidad para quedarse quieto convulsiones rigidez de los Exelon Corporation ideas suicidas u otros cambios en el estado de  nimo dificultad para conciliar el sueo sangrado o moretones inusuales cansancio o debilidad inusual vmito Efectos secundarios que generalmente no requieren atencin mdica (infrmelos a su mdico o a Producer, television/film/video de la salud si persisten o si son molestos): cambios en el apetito o el peso cambios en el deseo o desempeo sexual diarrea boca seca dolor de cabeza aumento de la sudoracin nuseas temblores Puede ser que esta lista no menciona todos los posibles efectos secundarios. Comunquese a su mdico por asesoramiento mdico Hewlett[employer]ard. Usted puede informar los efectos secundarios a la FDA por telfono al 1-800-FDA-1088. Dnde debo guardar mi medicina? Mantngala fuera del alcance de los nios. Gurdela a Sanmina-SCI, entre 15 y 30 grados C (48 y 57 grados F). Deseche todo el medicamento que no haya utilizado, despus de la fecha de vencimiento. ATENCIN: Este folleto es un resumen. Puede ser que no cubra toda la posible informacin. Si usted tiene preguntas acerca de esta medicina, consulte con su mdico, su farmacutico o su profesional de Radiographer, therapeutic.  2018 Elsevier/Gold Standard (2016-10-06 00:00:00)     IF you received an x-ray today, you will receive an invoice from [facility] Radiology. Please contact [facility] Radiology at [phone] with questions or concerns regarding your invoice.   IF you received labwork today, you will receive an invoice from [facility]. Please contact [facility] at [phone] with questions or concerns regarding your invoice.   Our billing staff will not be able to assist you with questions regarding bills from these companies.  You will be contacted with the lab results as soon as they are available. The fastest way to get your results is to activate your My Chart account. Instructions are located on the last page of this paperwork. If you have not heard from [place] regarding the results  in 2 weeks, please contact this office.

## 2017-10-05 NOTE — Progress Notes (Signed)
   MRN: [id number] DOB: [birth year]  Subjective:   Samuel Dennis is a 47 y.o. male presenting for follow up on anxiety and depression. Patient is having ongoing difficulty with his son at home. Stress is related to what his son will be doing in the future and lack of motivation. He is 47 years old currently and is having a hard time dealing with his son. Has had anxiety and depression before. Denies SI, HI. Has been using Klonopin and would like to have a refill.    Samuel Dennis currently has no medications in their medication list. Also has No Known Allergies.  Samuel Dennis  has a past medical history of Depression and Neuromuscular disorder (HCC). Also  has no past surgical history on file.  Objective:   Vitals: BP 122/67   Pulse 60   Temp 97.8 F (36.6 C) (Oral)   Resp 16   Ht 5\' 7"  (1.702 m)   Wt 167 lb 3.2 oz (75.8 kg)   SpO2 99%   BMI 26.19 kg/m   Physical Exam  Constitutional: He is oriented to person, place, and time. He appears well-developed and well-nourished.  Cardiovascular: Normal rate.  Pulmonary/Chest: Effort normal.  Neurological: He is alert and oriented to person, place, and time.  Psychiatric: His mood appears not anxious. His speech is not rapid and/or pressured, not delayed and not tangential. He is not slowed. He exhibits a depressed mood (flat affect). He expresses no homicidal and no suicidal ideation.   Assessment and Plan :   Anxiety and depression  Stress due to family tension  Will restart Prozac as he has done well with this in the past, titration dosing instructions provided to the patient. States that he did not like Zoloft. Will refill his clonazepam, discussed with patient that this is to be a short term medication, not meant for daily use. Counseled patient on potential for adverse effects with medications prescribed today, patient verbalized understanding. Follow up in 6 weeks.   Wallis BambergMario Kennette Cuthrell, PA-C [facility] [phone] 10/05/2017 11:33 AM

## 2017-11-16 ENCOUNTER — Ambulatory Visit: Payer: BLUE CROSS/BLUE SHIELD | Admitting: Urgent Care

## 2017-11-16 ENCOUNTER — Other Ambulatory Visit: Payer: Self-pay

## 2017-11-16 ENCOUNTER — Encounter: Payer: Self-pay | Admitting: Urgent Care

## 2017-11-16 VITALS — BP 102/64 | HR 84 | Temp 98.7°F | Resp 16 | Ht 67.0 in | Wt 167.0 lb

## 2017-11-16 DIAGNOSIS — F32A Depression, unspecified: Secondary | ICD-10-CM

## 2017-11-16 DIAGNOSIS — F419 Anxiety disorder, unspecified: Secondary | ICD-10-CM

## 2017-11-16 DIAGNOSIS — F329 Major depressive disorder, single episode, unspecified: Secondary | ICD-10-CM

## 2017-11-16 DIAGNOSIS — G4700 Insomnia, unspecified: Secondary | ICD-10-CM

## 2017-11-16 DIAGNOSIS — Z638 Other specified problems related to primary support group: Secondary | ICD-10-CM

## 2017-11-16 MED ORDER — FLUOXETINE HCL 40 MG PO CAPS: 40.0000 mg | ORAL_CAPSULE | Freq: Every day | ORAL | 1 refills | Status: DC

## 2017-11-16 MED ORDER — CLONAZEPAM 0.5 MG PO TABS: 0.5000 mg | ORAL_TABLET | Freq: Every evening | ORAL | 0 refills | Status: DC | PRN

## 2017-11-16 MED ORDER — TRAZODONE HCL 50 MG PO TABS: 50.0000 mg | ORAL_TABLET | Freq: Every evening | ORAL | 2 refills | Status: DC | PRN

## 2017-11-16 NOTE — Patient Instructions (Addendum)
Trazodone tablets Qu es este medicamento? La TRAZODONA se utiliza para tratar la depresin. Este medicamento puede ser utilizado para otros usos; si tiene alguna pregunta consulte con su proveedor de atencin mdica o con su farmacutico. MARCAS COMUNES: Desyrel Qu le debo informar a mi profesional de la salud antes de tomar este medicamento? Necesita saber si usted presenta alguno de los siguientes problemas o situaciones: -intento de suicidio o con ideas suicidas -trastorno bipolar -problemas sanguneos -glaucoma -enfermedad cardiaca o ataque cardiaco previo -latidos cardiacos irregulares -enfermedad renal o heptica -niveles bajos de sodio en la sangre -una reaccin alrgica o inusual a la trazodona, a otros medicamentos, alimentos, colorantes o conservantes -si est embarazada o buscando quedar embarazada -si est amamantando a un beb Cmo debo utilizar este medicamento? Tome este medicamento por va oral con un vaso de agua. Siga las instrucciones de la etiqueta del [place]. [employer] Corporation medicamento poco tiempo despus de una comida o un refrigerio ligero. Tome su medicamento a intervalos regulares. No tome su medicamento con una frecuencia mayor a la indicada. No deje de tomar Coca-Cola de repente a menos que as lo indique su mdico. Dejar de Insurance account manager medicamento demasiado rpido puede causar efectos secundarios graves o podra empeorar su afeccin. Su farmacutico le dar una Gua del medicamento especial (MedGuide, nombre en ingls) con cada receta y en cada ocasin que la vuelva a surtir. Asegrese de leer esta informacin cada vez cuidadosamente. Hable con su pediatra para informarse acerca del uso de este medicamento en nios. Puede requerir atencin especial. Sobredosis: Pngase en contacto inmediatamente con un centro toxicolgico o una sala de urgencia si usted cree que haya tomado demasiado medicamento. ATENCIN: ConAgra Foods es solo para usted. No  comparta este medicamento con nadie. Qu sucede si me olvido de una dosis? Si olvida una dosis, tmela lo antes posible. Si es casi la hora de la prxima dosis, tome slo esa dosis. No tome dosis adicionales o dobles. Qu puede interactuar con este medicamento? No tome esta medicina con ninguno de los siguientes medicamentos: ciertos medicamentos para infecciones micticas, tales como fluconazol, quetoconazol, itraconazol, posaconazol, voriconazol cisapride dofetilida dronedarona linezolid IMAOs, tales como Carbex, Eldepryl, Marplan, Nardil y Parnate mesoridazina azul de metileno (va intravenosa) pimozida saquinavir tioridazina ziprasidona Esta medicina tambin puede interactuar con los siguientes medicamentos: alcohol medicamentos antivricos para el VIH o SIDA aspirina o medicamentos tipo aspirina barbitricos tales como el fenobarbital ciertos medicamentos para la presin sangunea, enfermedad cardiaca, pulso cardiaco irregular ciertos medicamentos para la depresin, ansiedad o trastornos psicticos ciertos medicamentos para las migraas, tales como almotriptn, eletriptn, frovatriptn, naratriptn, rizatriptn, sumatriptn, zolmitriptn ciertos medicamentos para convulsiones, tales como carbamazepina y fenitona ciertos medicamento para conciliar el sueo ciertos medicamentos que tratan o previenen cogulos sanguneos, como dalteparina, enoxaparina, warfarina digoxina fentanilo litio los [place], medicamentos para el dolor o inflamacin, como ibuprofeno o naproxeno otros medicamentos que prolongan el intervalo QT (causa un ritmo cardiaco anormal) rasagilina medicamentos a base de hierbas que contienen kava kava, hierba de [place] o valeriana tramadol triptfano Puede ser que esta lista no menciona todas las posibles interacciones. Informe a su profesional de KB Home	[place] de [employer] Corporation productos a base de hierbas, medicamentos de [place] o suplementos nutritivos que est tomando. Si usted fuma, consume  bebidas alcohlicas o si utiliza drogas ilegales, indqueselo tambin a su profesional de KB Home	[place]. Algunas sustancias pueden interactuar con su medicamento. A qu debo estar atento al usar Coca-Cola? Informe a su mdico si sus sntomas no  mejoran o si empeoran. Visite a su mdico o a su profesional de la salud para chequear su evolucin peridicamente. Debido que puede ser necesario tomar este medicamento durante varias semanas para que sea posible observar sus efectos en forma Pasadenacompleta, es importante que sigue su tratamiento como recetado por su mdico. Los pacientes y sus familias deben estar atentos si empeora la depresin o ideas suicidas. Tambin est atento a cambios repentinos o severos de emocin, tales como el sentirse ansioso, agitado, lleno de pnico, irritable, hostil, agresivo, impulsivo, inquietud severa, demasiado excitado y hiperactivo o dificultad para conciliar el sueo. Si esto ocurre, especialmente al comenzar con el tratamiento o al cambiar de dosis, comunquese con su profesional de Beazer Homesla salud. Puede experimentar somnolencia, mareos o visin borrosa. No conduzca ni utilice maquinaria, ni haga nada que Scientist, research (life sciences)le exija permanecer en estado de alerta hasta que sepa cmo le afecta este medicamento. No se siente ni se ponga de pie con rapidez, especialmente si es un paciente de edad avanzada. Esto reduce el riesgo de mareos o Newell Rubbermaiddesmayos. El alcohol puede interferir con el efecto de [place] medicamento. Evite consumir bebidas alcohlicas. Este medicamento puede provocar sequedad de los ojos y visin borrosa. Su Botswanausa lentes de contacto, puede sentir [facility] Hospitalsalgunas molestias. Las gotas lubricantes pueden ayudarle. Si el problema no desaparece o es severo, visite a su mdico de ojos. Este medicamento puede secarle la boca. El Product managermasticar chicle sin azcar, chupar caramelos duros y tomar agua en abundancia le ayudarn a mantener la boca hmeda. Si el problema no desaparece o es severo, consulte a su mdico. Qu  efectos secundarios puedo tener al Boston Scientificutilizar este medicamento? Efectos secundarios que debe informar a su mdico o a Producer, television/film/videosu profesional de la salud tan pronto como sea posible: Therapist, artreacciones alrgicas, como erupcin cutnea, comezn/picazn o urticarias, e hinchazn de la cara, los labios o la lengua estado de nimo elevado, menor necesidad de dormir, pensamientos acelerados, conducta impulsiva confusin ritmo cardiaco rpido, irregular sensacin de desmayos o aturdimiento, cadas sensacin de agitacin, enojo o irritabilidad prdida de equilibrio o coordinacin ereccin dolorosa o prolongada inquietud, caminar de un lado a otro, incapacidad para quedarse quieto ideas suicidas u otros cambios en el estado de nimo temblores dificultad para conciliar el sueo convulsiones sangrado o moretones inusuales Efectos secundarios que generalmente no requieren atencin mdica (infrmelos a su mdico o a Producer, television/film/videosu profesional de la salud si persisten o si son molestos): cambios en el deseo o desempeo sexual cambios en el apetito o el peso estreimiento dolor de cabeza dolores musculares nuseas Puede ser que esta lista no menciona todos los posibles efectos secundarios. Comunquese a su mdico por asesoramiento mdico Hewlett-Packardsobre los efectos secundarios. Usted puede informar los efectos secundarios a la FDA por telfono al 1-800-FDA-1088. Dnde debo guardar mi medicina? Mantngala fuera del alcance de los nios. Gurdela a Sanmina-SCItemperatura ambiente, entre 15 y 30 grados C (6059 y 286 grados F). Protjala de la luz. Mantenga el envase bien cerrado. Deseche los medicamentos que no haya utilizado, despus de la fecha de vencimiento. ATENCIN: Este folleto es un resumen. Puede ser que no cubra toda la posible informacin. Si usted tiene preguntas acerca de esta medicina, consulte con su mdico, su farmacutico o su profesional de Radiographer, therapeuticla salud.  2018 Elsevier/Gold Standard (2016-10-06 00:00:00)     IF you received an x-ray today, you will receive  an invoice [facility]. Please contact [facility] at [phone] with questions or concerns regarding your invoice.   IF you received labwork today,  you will receive an invoice from [facility]. Please contact [facility] at [phone] with questions or concerns regarding your invoice.   Our billing staff will not be able to assist you with questions regarding bills from these companies.  You will be contacted with the lab results as soon as they are available. The fastest way to get your results is to activate your My Chart account. Instructions are located on the last page of this paperwork. If you have not heard from [place] regarding the results in 2 weeks, please contact this office.

## 2017-11-16 NOTE — Progress Notes (Signed)
    MRN: [id number] DOB: 02-10-1971  Subjective:   Samuel Dennis is a 47 y.o. male presenting for follow up on anxiety, depression. Reports that his anxiety is better overall but still has a hard time slowing his mind down at night and going to sleep. Has difficulty controlling his worrying. He is using clonazepam is for panic attacks still which have been happening less. Denies SI, HI, chest pain, n/v, abdominal pain. Denies smoking cigarettes.  Bergen has a current medication list which includes the following prescription(s): clonazepam and fluoxetine. Also has No Known Allergies.  Morgen  has a past medical history of Depression and Neuromuscular disorder (HCC). Denies past surgical history.  Objective:   Vitals: BP 102/64 (BP Location: Right Arm, Patient Position: Sitting, Cuff Size: Normal)   Pulse 84   Temp 98.7 F (37.1 C) (Oral)   Resp 16   Ht 5\' 7"  (1.702 m)   Wt 167 lb (75.8 kg)   SpO2 98%   BMI 26.16 kg/m   Physical Exam  Constitutional: He is oriented to person, place, and time. He appears well-developed and well-nourished.  HENT:  Mouth/Throat: Oropharynx is clear and moist.  Eyes: No scleral icterus.  Neck: Normal range of motion. Neck supple. No thyromegaly present.  Cardiovascular: Normal rate, regular rhythm and intact distal pulses. Exam reveals no gallop and no friction rub.  No murmur heard. Pulmonary/Chest: No respiratory distress. He has no wheezes. He has no rales.  Neurological: He is alert and oriented to person, place, and time.  Psychiatric:  Flat affect.   Assessment and Plan :   Anxiety and depression  Stress due to family tension  Insomnia, unspecified type  Will increase Prozac to 40mg , add trazodone. Refilled clonazepam. Return-to-clinic precautions discussed, patient verbalized understanding. Will follow up in 4 weeks or 3 months if patient does well with dose increase of Prozac, new medication (trazodone).  Wallis BambergMario Lyrah Bradt,  PA-C [facility] [phone] 11/16/2017 10:15 AM

## 2018-02-27 ENCOUNTER — Ambulatory Visit: Payer: BLUE CROSS/BLUE SHIELD | Admitting: Emergency Medicine

## 2018-02-27 ENCOUNTER — Encounter: Payer: Self-pay | Admitting: Emergency Medicine

## 2018-02-27 ENCOUNTER — Other Ambulatory Visit: Payer: Self-pay

## 2018-02-27 VITALS — BP 102/66 | HR 73 | Temp 97.9°F | Resp 16 | Ht 68.0 in | Wt 167.2 lb

## 2018-02-27 DIAGNOSIS — M62838 Other muscle spasm: Secondary | ICD-10-CM

## 2018-02-27 DIAGNOSIS — M545 Low back pain, unspecified: Secondary | ICD-10-CM

## 2018-02-27 DIAGNOSIS — M549 Dorsalgia, unspecified: Secondary | ICD-10-CM

## 2018-02-27 DIAGNOSIS — M7918 Myalgia, other site: Secondary | ICD-10-CM | POA: Diagnosis not present

## 2018-02-27 LAB — POCT URINALYSIS DIP (MANUAL ENTRY)
Bilirubin, UA: NEGATIVE
Blood, UA: NEGATIVE
GLUCOSE UA: NEGATIVE mg/dL
Ketones, POC UA: NEGATIVE mg/dL
LEUKOCYTES UA: NEGATIVE
NITRITE UA: NEGATIVE
PROTEIN UA: NEGATIVE mg/dL
Spec Grav, UA: 1.015 (ref 1.010–1.025)
UROBILINOGEN UA: 0.2 U/dL
pH, UA: 6.5 (ref 5.0–8.0)

## 2018-02-27 MED ORDER — CYCLOBENZAPRINE HCL 10 MG PO TABS: 10.0000 mg | ORAL_TABLET | Freq: Every day | ORAL | 0 refills | Status: DC

## 2018-02-27 MED ORDER — DICLOFENAC SODIUM 75 MG PO TBEC: 75.0000 mg | DELAYED_RELEASE_TABLET | Freq: Two times a day (BID) | ORAL | 0 refills | Status: AC

## 2018-02-27 NOTE — Patient Instructions (Addendum)
IF you received an x-ray today, you will receive an invoice from [facility]. Please contact [facility] at [phone] with questions or concerns regarding your invoice.   IF you received labwork today, you will receive an invoice from [facility]. Please contact [facility] at [phone] with questions or concerns regarding your invoice.   Our billing staff will not be able to assist you with questions regarding bills from these companies.  You will be contacted with the lab results as soon as they are available. The fastest way to get your results is to activate your My Chart account. Instructions are located on the last page of this paperwork. If you have not heard from us regarding the results in 2 weeks, please contact this office.     Dolor de espalda en adultos (Back Pain, Adult) El dolor de espalda es muy frecuente. A menudo mejora con el tiempo. La causa del dolor de espalda generalmente no es peligrosa. La mayora de las personas puede aprender a manejar el dolor de espalda por s mismas. CUIDADOS EN EL HOGAR Controle su dolor de espalda a fin de detectar algn cambio. Las siguientes indicaciones ayudarn a aliviar cualquier dolor que pueda sentir:  Mantngase activo. Comience con caminatas cortas sobre superficies planas si es posible. Trate de caminar un poco ms cada da.  Haga ejercicios con regularidad tal como le indic el mdico. El ejercicio ayuda a que su espalda se cure ms rpidamente. Tambin ayuda a prevenir futuras lesiones al mantener los msculos fuertes y flexibles.  No se siente, conduzca ni permanezca de pie durante ms de 30 minutos.  No permanezca en la cama. Si hace reposo ms de 1 a 2 das, puede demorar su recuperacin.  Sea cuidadoso al inclinarse o levantar un objeto. Use una tcnica apropiada para levantar peso: ? Flexione las rodillas. ? Mantenga el objeto cerca del cuerpo. ? No gire.  Duerma sobre un colchn firme. Recustese  sobre un costado y flexione las rodillas. Si se recuesta sobre la espalda, coloque una almohada debajo de las rodillas.  Tome los medicamentos solamente como se lo haya indicado el mdico.  Aplique hielo sobre la zona lesionada. ? Ponga el hielo en una bolsa plstica. ? Coloque una toalla entre la piel y la bolsa de hielo. ? Deje el hielo durante 20minutos, 2 a 3veces por da, durante los primeros 2 o 3das. Despus de eso, puede alternar entre compresas de hielo y calor.  Evite sentir ansiedad o estrs. Encuentre maneras efectivas de lidiar con el estrs, como hacer ejercicio.  Mantenga un peso saludable. El peso excesivo ejerce tensin sobre la espalda. SOLICITE AYUDA SI:  Siente dolor que no se alivia con reposo o medicamentos.  Siente cada vez ms dolor que se extiende a las piernas o los glteos.  El dolor no mejora en una semana.  Siente dolor por la noche.  Pierde peso.  Siente escalofros o fiebre. SOLICITE AYUDA DE INMEDIATO SI:  No puede controlar su materia fecal (heces) o el pis (orina).  Siente debilidad en las piernas o los brazos.  Siente prdida de la sensibilidad (adormecimiento) en las piernas o los brazos.  Tiene malestar estomacal (nuseas) o vomita.  Siente dolor de estmago (abdominal).  Siente que se desvanece (se desmaya). Esta informacin no tiene como fin reemplazar el consejo del mdico. Asegrese de hacerle al mdico cualquier pregunta que tenga. Document Released: 03/21/2011 Document Revised: 09/26/2014 Document Reviewed: 01/07/2014 Elsevier Interactive Patient Education  2018 Elsevier Inc.  

## 2018-02-27 NOTE — Progress Notes (Signed)
Samuel Dennis 47 y.o.   Chief Complaint  Patient presents with  . Back Pain    lower started 02/26/18    HISTORY OF PRESENT ILLNESS: This is a 47 y.o. male complaining of low back pain that started yesterday with no associated symptomatology.  Denies injuries.  Patient is a Corporate investment bankerconstruction worker.  No bladder or bowel problems.  On no medications at present time.  Denies any abdominal pain or urinary symptoms.  Denies radiation of pain. Onset: Yesterday Location: Lumbar area Duration: Constant  character:sharp Aggravating factors:movement Relieving factors:rest Therapies tried:Advil today Severity:7/10   HPI   Prior to Admission medications   Medication Sig Start Date End Date Taking? Authorizing Provider  clonazePAM (KLONOPIN) 0.5 MG tablet Take 1 tablet (0.5 mg total) by mouth at bedtime as needed for anxiety. Patient not taking: Reported on 02/27/2018 11/16/17   Wallis BambergMani, Mario, PA-C  FLUoxetine (PROZAC) 40 MG capsule Take 1 capsule (40 mg total) by mouth daily. Patient not taking: Reported on 02/27/2018 11/16/17   Wallis BambergMani, Mario, PA-C  traZODone (DESYREL) 50 MG tablet Take 1 tablet (50 mg total) by mouth at bedtime as needed for sleep. Patient not taking: Reported on 02/27/2018 11/16/17   Wallis BambergMani, Mario, PA-C    No Known Allergies  There are no active problems to display for this patient.   Past Medical History:  Diagnosis Date  . Depression   . Neuromuscular disorder (HCC)     No past surgical history on file.  Social History   Socioeconomic History  . Marital status: Married    Spouse name: Not on file  . Number of children: Not on file  . Years of education: Not on file  . Highest education level: Not on file  Occupational History  . Not on file  Social Needs  . Financial resource strain: Not on file  . Food insecurity:    Worry: Not on file    Inability: Not on file  . Transportation needs:    Medical: Not on file    Non-medical: Not on file  Tobacco Use  .  Smoking status: Never Smoker  . Smokeless tobacco: Never Used  Substance and Sexual Activity  . Alcohol use: No    Alcohol/week: 0.0 oz  . Drug use: No  . Sexual activity: Not on file  Lifestyle  . Physical activity:    Days per week: Not on file    Minutes per session: Not on file  . Stress: Not on file  Relationships  . Social connections:    Talks on phone: Not on file    Gets together: Not on file    Attends religious service: Not on file    Active member of club or organization: Not on file    Attends meetings of clubs or organizations: Not on file    Relationship status: Not on file  . Intimate partner violence:    Fear of current or ex partner: Not on file    Emotionally abused: Not on file    Physically abused: Not on file    Forced sexual activity: Not on file  Other Topics Concern  . Not on file  Social History Narrative  . Not on file    Family History  Problem Relation Age of Onset  . Diabetes Mother      Review of Systems  Constitutional: Negative.  Negative for chills, fever and weight loss.  HENT: Negative.   Eyes: Negative.   Respiratory: Negative.  Negative for cough  and shortness of breath.   Cardiovascular: Negative.  Negative for chest pain.  Gastrointestinal: Negative.  Negative for abdominal pain, diarrhea, nausea and vomiting.  Genitourinary: Negative.  Negative for dysuria and hematuria.  Musculoskeletal: Positive for back pain. Negative for myalgias and neck pain.  Skin: Negative.  Negative for rash.  Neurological: Negative.  Negative for dizziness, sensory change, focal weakness and headaches.  Endo/Heme/Allergies: Negative.   All other systems reviewed and are negative.   Vitals:   02/27/18 0901  BP: 102/66  Pulse: 73  Resp: 16  Temp: 97.9 F (36.6 C)  SpO2: 98%    Physical Exam  Constitutional: He is oriented to person, place, and time. He appears well-developed and well-nourished.  HENT:  Head: Normocephalic and atraumatic.   Left Ear: External ear normal.  Nose: Nose normal.  Mouth/Throat: Oropharynx is clear and moist.  Eyes: Pupils are equal, round, and reactive to light. Conjunctivae and EOM are normal.  Neck: Normal range of motion. Neck supple. No JVD present. No thyromegaly present.  Cardiovascular: Normal rate, regular rhythm and normal heart sounds.  Pulmonary/Chest: Effort normal and breath sounds normal.  Abdominal: Soft. Bowel sounds are normal. He exhibits no distension. There is no tenderness.  Musculoskeletal:       Lumbar back: He exhibits decreased range of motion, tenderness and spasm. He exhibits no bony tenderness and normal pulse.  Lymphadenopathy:    He has no cervical adenopathy.  Neurological: He is alert and oriented to person, place, and time. He displays normal reflexes. No cranial nerve deficit or sensory deficit. He exhibits normal muscle tone. Coordination normal.  Skin: Skin is warm and dry. Capillary refill takes less than 2 seconds.  Psychiatric: He has a normal mood and affect. His behavior is normal.  Vitals reviewed.   Results for orders placed or performed in visit on 02/27/18 (from the past 24 hour(s))  POCT urinalysis dipstick     Status: None   Collection Time: 02/27/18  9:26 AM  Result Value Ref Range   Color, UA yellow yellow   Clarity, UA clear clear   Glucose, UA negative negative mg/dL   Bilirubin, UA negative negative   Ketones, POC UA negative negative mg/dL   Spec Grav, UA 1.610 9.604 - 1.025   Blood, UA negative negative   pH, UA 6.5 5.0 - 8.0   Protein Ur, POC negative negative mg/dL   Urobilinogen, UA 0.2 0.2 or 1.0 E.U./dL   Nitrite, UA Negative Negative   Leukocytes, UA Negative Negative    ASSESSMENT & PLAN: Samuel Dennis was seen today for back pain.  Diagnoses and all orders for this visit:  Acute back pain, unspecified back location, unspecified back pain laterality -     POCT urinalysis dipstick  Musculoskeletal pain -     diclofenac  (VOLTAREN) 75 MG EC tablet; Take 1 tablet (75 mg total) by mouth 2 (two) times daily for 5 days. After 5 days take as needed.  Muscle spasm -     cyclobenzaprine (FLEXERIL) 10 MG tablet; Take 1 tablet (10 mg total) by mouth at bedtime.  Lumbar pain    Patient Instructions       IF you received an x-ray today, you will receive an invoice from [facility]. Please contact [facility] at [phone] with questions or concerns regarding your invoice.   IF you received labwork today, you will receive an invoice from [facility]. Please contact [facility] at [phone] with questions or concerns regarding your invoice.  Our billing staff will not be able to assist you with questions regarding bills from these companies.  You will be contacted with the lab results as soon as they are available. The fastest way to get your results is to activate your My Chart account. Instructions are located on the last page of this paperwork. If you have not heard from [place] regarding the results in 2 weeks, please contact this office.     Dolor de espalda en adultos (Back Pain, Adult) El dolor de espalda es muy frecuente. A menudo mejora con el tiempo. La causa del dolor de espalda generalmente no es peligrosa. La Harley[employer]son de las personas puede aprender a Runner, broadcasting/film/video de espalda por s mismas. CUIDADOS EN EL HOGAR Controle su dolor de espalda a fin de Public house manager cambio. Las siguientes indicaciones ayudarn a Psychologist, clinical que pueda sentir:  Materials engineer. Comience con caminatas cortas sobre superficies planas si es posible. Trate de caminar un poco ms cada da.  Haga ejercicios con regularidad tal como le indic el mdico. El ejercicio ayuda a que su espalda se cure ms rpidamente. Tambin ayuda a prevenir futuras lesiones al [employer] fuertes y flexibles.  No se siente, conduzca ni permanezca de pie durante ms de 30 minutos.  No permanezca en la cama.  Si hace reposo ms de 1 a 2 das, puede demorar su recuperacin.  Sea cuidadoso al inclinarse o levantar un objeto. Use una tcnica apropiada para levantar peso: ? Flexione las rodillas. ? Mantenga el objeto cerca del cuerpo. ? No gire.  Duerma sobre un [employer]VR Inc. Recustese sobre un costado y flexione las rodillas. Si se recuesta Fisher Scientific, coloque una almohada debajo de las rodillas.  Tome los medicamentos solamente como se lo haya indicado el mdico.  Aplique hielo sobre la zona lesionada. ? Ponga el hielo en una bolsa plstica. ? Coloque una FirstEnergy Corp piel y la bolsa de hielo. ? Deje el hielo durante , 2 a 3veces por da, durante los primeros 2 o 3das. Despus de eso, puede alternar entre compresas de hielo y Airline pilot.  Evite sentir ansiedad o estrs. Encuentre maneras efectivas de lidiar con el estrs, Surveyor, mining ejercicio.  Mantenga un peso saludable. El peso excesivo ejerce tensin sobre la espalda. SOLICITE AYUDA SI:  Siente dolor que no se alivia con reposo o medicamentos.  Siente cada vez ms dolor que se extiende a las piernas o los glteos.  El dolor no mejora en una semana.  Siente dolor por la noche.  Pierde peso.  Siente escalofros o fiebre. SOLICITE AYUDA DE INMEDIATO SI:  No puede controlar su materia fecal (heces) o el pis (orina).  Siente debilidad en las piernas o los brazos.  Siente prdida de la sensibilidad (adormecimiento) en las piernas o los brazos.  Tiene malestar estomacal (nuseas) o vomita.  Siente dolor de estmago (abdominal).  Siente que se desvanece (se desmaya). Esta informacin no tiene Theme park manager el consejo del mdico. Asegrese de hacerle al mdico cualquier pregunta que tenga. Document Released: 03/21/2011 Document Revised: 09/26/2014 Document Reviewed: 01/07/2014 Elsevier Interactive Patient Education  2018 Elsevier Inc.      Edwina Barth, MD [facility]

## 2018-07-20 ENCOUNTER — Ambulatory Visit: Payer: BLUE CROSS/BLUE SHIELD | Admitting: Emergency Medicine

## 2018-07-25 ENCOUNTER — Ambulatory Visit: Payer: BLUE CROSS/BLUE SHIELD | Admitting: Family Medicine

## 2018-08-25 ENCOUNTER — Encounter

## 2018-08-25 ENCOUNTER — Ambulatory Visit: Payer: BLUE CROSS/BLUE SHIELD | Admitting: Family Medicine

## 2018-08-25 ENCOUNTER — Other Ambulatory Visit: Payer: Self-pay

## 2018-08-25 ENCOUNTER — Encounter: Payer: Self-pay | Admitting: Family Medicine

## 2018-08-25 VITALS — BP 110/66 | HR 84 | Temp 98.0°F | Resp 16 | Wt 171.0 lb

## 2018-08-25 DIAGNOSIS — J020 Streptococcal pharyngitis: Secondary | ICD-10-CM

## 2018-08-25 DIAGNOSIS — J029 Acute pharyngitis, unspecified: Secondary | ICD-10-CM | POA: Diagnosis not present

## 2018-08-25 LAB — POCT RAPID STREP A (OFFICE): Rapid Strep A Screen: POSITIVE — AB

## 2018-08-25 MED ORDER — AMOXICILLIN 500 MG PO TABS: 500.0000 mg | ORAL_TABLET | Freq: Two times a day (BID) | ORAL | 0 refills | Status: AC

## 2018-08-25 NOTE — Progress Notes (Signed)
Patient ID: Samuel Dennis, male    DOB: August 29, 1971, 47 y.o.   MRN: [id number]  PCP: Patient, [name]  Chief Complaint  Patient presents with  . Sore Throat    with some fever x 5 days     Subjective:  HPI Samuel Dennis is a 47 y.o. male presents for evaluation of soreness of throat.  Sore throat present 3 days although feeling aching with fatigue for 5 days.  No known sick contacts.  He denies nausea or vomiting.  Has had some abdominal upset symptoms.  Complains of subjective fever over the last 5 days.  He has taken Advil prior to visit today.   Social History   Socioeconomic History  . Marital status: Married    Spouse name: Not on file  . Number of children: Not on file  . Years of education: Not on file  . Highest education level: Not on file  Occupational History  . Not on file  Social Needs  . Financial resource strain: Not on file  . Food insecurity:    Worry: Not on file    Inability: Not on file  . Transportation needs:    Medical: Not on file    Non-medical: Not on file  Tobacco Use  . Smoking status: Never Smoker  . Smokeless tobacco: Never Used  Substance and Sexual Activity  . Alcohol use: No    Alcohol/week: 0.0 standard drinks  . Drug use: No  . Sexual activity: Not on file  Lifestyle  . Physical activity:    Days per week: Not on file    Minutes per session: Not on file  . Stress: Not on file  Relationships  . Social connections:    Talks on phone: Not on file    Gets together: Not on file    Attends religious service: Not on file    Active member of club or organization: Not on file    Attends meetings of clubs or organizations: Not on file    Relationship status: Not on file  . Intimate partner violence:    Fear of current or ex partner: Not on file    Emotionally abused: Not on file    Physically abused: Not on file    Forced sexual activity: Not on file  Other Topics Concern  . Not on file  Social History Narrative  . Not  on file    Family History  Problem Relation Age of Onset  . Diabetes Mother     Review of Systems Pertinent negatives listed in HPI Patient Active Problem List   Diagnosis Date Noted  . Lumbar pain 02/27/2018  . Musculoskeletal pain 02/27/2018  . Muscle spasm 02/27/2018    No Known Allergies  Prior to Admission medications   Medication Sig Start Date End Date Taking? Authorizing Provider  clonazePAM (KLONOPIN) 0.5 MG tablet Take 1 tablet (0.5 mg total) by mouth at bedtime as needed for anxiety. 11/16/17  Yes Wallis BambergMani, Mario, PA-C  cyclobenzaprine (FLEXERIL) 10 MG tablet Take 1 tablet (10 mg total) by mouth at bedtime. 02/27/18  Yes Sagardia, Eilleen KempfMiguel Jose, MD  FLUoxetine (PROZAC) 40 MG capsule Take 1 capsule (40 mg total) by mouth daily. 11/16/17  Yes Wallis BambergMani, Mario, PA-C  traZODone (DESYREL) 50 MG tablet Take 1 tablet (50 mg total) by mouth at bedtime as needed for sleep. 11/16/17  Yes Wallis BambergMani, Mario, PA-C    Past Medical, Surgical Family and Social History reviewed and updated.    Objective:  Today's Vitals   08/25/18 1000  BP: 110/66  Pulse: 84  Resp: 16  Temp: 98 F (36.7 C)  TempSrc: Oral  SpO2: 98%  Weight: 171 lb (77.6 kg)    Wt Readings from Last 3 Encounters:  08/25/18 171 lb (77.6 kg)  02/27/18 167 lb 3.2 oz (75.8 kg)  11/16/17 167 lb (75.8 kg)     Physical Exam General Appearance:    Alert, cooperative, no distress  HENT:   bilateral TM normal without fluid or infection, neck has bilateral anterior cervical nodes enlarged, pharynx erythematous without exudate and tonsils red, enlarged, with exudate present  Eyes:    PERRL, conjunctiva/corneas clear, EOM's intact       Lungs:     Clear to auscultation bilaterally, respirations unlabored  Heart:    Regular rate and rhythm, no murmurs or gallops auscultated on exam  Neurologic:   Awake, alert, oriented x 3. No apparent focal neurological           defect.     Lab Results  Component Value Date   POCGLU 101 (A)  09/30/2014        Assessment & Plan:  1. Strep throat - POCT rapid strep A, positive  Treating empirically with amoxicillin 500 mg twice daily x10 days.   Encourage salt water warm gargles to reduce throat pain.  He can continue Tylenol or ibuprofen also for pain management.   Meds ordered this encounter  Medications  . amoxicillin (AMOXIL) 500 MG tablet    Sig: Take 1 tablet (500 mg total) by mouth 2 (two) times daily for 10 days.    Dispense:  20 tablet    Refill:  0      -The patient was given clear instructions to go to ER or return to medical center if symptoms do not improve, worsen or new problems develop. The patient verbalized understanding.     Godfrey Pick. Tiburcio Pea, FNP-C Nurse Practitioner (PRN Staff)  Primary Care at [facility] [address] [place], [place]  [phone]

## 2018-08-25 NOTE — Patient Instructions (Addendum)
If you have lab work done today you will be contacted with your lab results within the next 2 weeks.  If you have not heard from us then please contact us. The fastest way to get your results is to register for My Chart.   IF you received an x-ray today, you will receive an invoice from [facility]. Please contact [facility] at [phone] with questions or concerns regarding your invoice.   IF you received labwork today, you will receive an invoice from [facility]. Please contact [facility] at [phone] with questions or concerns regarding your invoice.   Our billing staff will not be able to assist you with questions regarding bills from these companies.  You will be contacted with the lab results as soon as they are available. The fastest way to get your results is to activate your My Chart account. Instructions are located on the last page of this paperwork. If you have not heard from us regarding the results in 2 weeks, please contact this office.     Strep Throat Strep throat is an infection of the throat. It is caused by germs. Strep throat spreads from person to person because of coughing, sneezing, or close contact. Follow these instructions at home: Medicines  Take over-the-counter and prescription medicines only as told by your doctor.  Take your antibiotic medicine as told by your doctor. Do not stop taking the medicine even if you feel better.  Have family members who also have a sore throat or fever go to a doctor. Eating and drinking  Do not share food, drinking cups, or personal items.  Try eating soft foods until your sore throat feels better.  Drink enough fluid to keep your pee (urine) clear or pale yellow. General instructions  Rinse your mouth (gargle) with a salt-water mixture 3-4 times per day or as needed. To make a salt-water mixture, stir -1 tsp of salt into 1 cup of warm water.  Make sure that all people in your house wash their hands  well.  Rest.  Stay home from school or work until you have been taking antibiotics for 24 hours.  Keep all follow-up visits as told by your doctor. This is important. Contact a doctor if:  Your neck keeps getting bigger.  You get a rash, cough, or earache.  You cough up thick liquid that is green, yellow-brown, or bloody.  You have pain that does not get better with medicine.  Your problems get worse instead of getting better.  You have a fever. Get help right away if:  You throw up (vomit).  You get a very bad headache.  You neck hurts or it feels stiff.  You have chest pain or you are short of breath.  You have drooling, very bad throat pain, or changes in your voice.  Your neck is swollen or the skin gets red and tender.  Your mouth is dry or you are peeing less than normal.  You keep feeling more tired or it is hard to wake up.  Your joints are red or they hurt. This information is not intended to replace advice given to you by your health care provider. Make sure you discuss any questions you have with your health care provider. Document Released: 02/22/2008 Document Revised: 05/04/2016 Document Reviewed: 12/29/2014 Elsevier Interactive Patient Education  [employer]2018 [employer]

## 2018-08-27 LAB — CULTURE, GROUP A STREP: Strep A Culture: POSITIVE — AB

## 2018-08-28 ENCOUNTER — Telehealth: Payer: Self-pay

## 2018-08-28 NOTE — Progress Notes (Signed)
Current tx with appropriate therapy

## 2018-08-28 NOTE — Telephone Encounter (Signed)
Call from [facility] to notify clinic of positive Group A Strep.  Provider has already reviewed result.

## 2018-09-10 DIAGNOSIS — F411 Generalized anxiety disorder: Secondary | ICD-10-CM | POA: Diagnosis not present

## 2018-09-10 DIAGNOSIS — G4700 Insomnia, unspecified: Secondary | ICD-10-CM | POA: Diagnosis not present

## 2018-09-10 DIAGNOSIS — G44221 Chronic tension-type headache, intractable: Secondary | ICD-10-CM | POA: Diagnosis not present

## 2019-07-08 ENCOUNTER — Other Ambulatory Visit: Payer: Self-pay

## 2019-07-08 DIAGNOSIS — Z20822 Contact with and (suspected) exposure to covid-19: Secondary | ICD-10-CM

## 2019-07-10 LAB — NOVEL CORONAVIRUS, NAA: SARS-CoV-2, NAA: NOT DETECTED

## 2019-07-12 ENCOUNTER — Other Ambulatory Visit: Payer: Self-pay

## 2019-07-12 DIAGNOSIS — Z20822 Contact with and (suspected) exposure to covid-19: Secondary | ICD-10-CM

## 2019-07-13 LAB — NOVEL CORONAVIRUS, NAA: SARS-CoV-2, NAA: DETECTED — AB

## 2019-08-02 ENCOUNTER — Other Ambulatory Visit: Payer: Self-pay

## 2019-08-02 DIAGNOSIS — Z20822 Contact with and (suspected) exposure to covid-19: Secondary | ICD-10-CM

## 2019-08-05 LAB — NOVEL CORONAVIRUS, NAA: SARS-CoV-2, NAA: NOT DETECTED

## 2020-08-24 ENCOUNTER — Encounter (HOSPITAL_COMMUNITY): Payer: Self-pay | Admitting: Emergency Medicine

## 2020-08-24 ENCOUNTER — Emergency Department (HOSPITAL_COMMUNITY): Payer: Self-pay

## 2020-08-24 ENCOUNTER — Emergency Department (HOSPITAL_COMMUNITY)
Admission: EM | Admit: 2020-08-24 | Discharge: 2020-08-24 | Disposition: A | Discharge status: Home / Self Care | Payer: Self-pay | Attending: Emergency Medicine | Admitting: Emergency Medicine

## 2020-08-24 DIAGNOSIS — S0091XA Abrasion of unspecified part of head, initial encounter: Secondary | ICD-10-CM | POA: Insufficient documentation

## 2020-08-24 DIAGNOSIS — S42014A Posterior displaced fracture of sternal end of right clavicle, initial encounter for closed fracture: Secondary | ICD-10-CM | POA: Insufficient documentation

## 2020-08-24 DIAGNOSIS — W11XXXA Fall on and from ladder, initial encounter: Secondary | ICD-10-CM | POA: Insufficient documentation

## 2020-08-24 DIAGNOSIS — W19XXXA Unspecified fall, initial encounter: Secondary | ICD-10-CM

## 2020-08-24 MED ORDER — IBUPROFEN 400 MG PO TABS
600.0000 mg | ORAL_TABLET | Freq: Once | ORAL | Status: DC
  Filled 2020-08-24: qty 1

## 2020-08-24 NOTE — ED Notes (Signed)
Patient transported to room.

## 2020-08-24 NOTE — Discharge Instructions (Signed)
You have a clavicle fracture, it is mildly displaced.  You will need to follow-up with orthopedics.  Please call to schedule follow-up appointment with Dr. Eulah Pont.  You should wear shoulder immobilizer at all times.  You can take ibuprofen, Tylenol and apply ice to help with pain.  If you notice discoloration over the skin over your collarbone, significantly worsened pain, numbness or tingling in the hand or any other new or concerning symptoms you should return for reevaluation.

## 2020-08-24 NOTE — ED Provider Notes (Signed)
[facility] EMERGENCY DEPARTMENT Provider Note   CSN: [id number] Arrival date & time: 08/24/20  1054     History Chief Complaint  Patient presents with  . Fall    Samuel Dennis is a 49 y.o. male.  Samuel Dennis is a 49 y.o. male with history of depression, back pain and muscle spasm, who presents to the emergency department after he fell from a ladder.  He states this happened about 2 hours prior to arrival.  He reports he was about 9 feet up on the ladder and he slid down falling onto his right shoulder and ribs.  Reports that he bumped his head and did have a small abrasion to the face but no LOC.  Denies any headache or pain over the face.  Denies any neck or back pain.  No numbness, tingling or weakness.  Does report some soreness over the right ribs but no shortness of breath or difficulty breathing.  No abdominal pain.  Reports soreness is primarily over the right shoulder, but he is able to range the arm.  Denies any pain in his other extremities.  Ambulatory without difficulty.  Took some over-the-counter pain medication given to him by a friend prior to arrival, no other medications.  No blood thinners.  No other aggravating or alleviating factors.        Past Medical History:  Diagnosis Date  . Depression   . Neuromuscular disorder [facility])     Patient Active Problem List   Diagnosis Date Noted  . Lumbar pain 02/27/2018  . Musculoskeletal pain 02/27/2018  . Muscle spasm 02/27/2018    History reviewed. No pertinent surgical history.     Family History  Problem Relation Age of Onset  . Diabetes Mother     Social History   Tobacco Use  . Smoking status: Never Smoker  . Smokeless tobacco: Never Used  Substance Use Topics  . Alcohol use: No    Alcohol/week: 0.0 standard drinks  . Drug use: No    Home Medications Prior to Admission medications   Medication Sig Start Date End Date Taking? Authorizing Provider  clonazePAM (KLONOPIN)  0.5 MG tablet Take 1 tablet (0.5 mg total) by mouth at bedtime as needed for anxiety. 11/16/17   Wallis Bamberg, PA-C  cyclobenzaprine (FLEXERIL) 10 MG tablet Take 1 tablet (10 mg total) by mouth at bedtime. 02/27/18   Georgina Quint, MD  FLUoxetine (PROZAC) 40 MG capsule Take 1 capsule (40 mg total) by mouth daily. 11/16/17   Wallis Bamberg, PA-C  traZODone (DESYREL) 50 MG tablet Take 1 tablet (50 mg total) by mouth at bedtime as needed for sleep. 11/16/17   Wallis Bamberg, PA-C    Allergies    Patient has no known allergies.  Review of Systems   Review of Systems  Constitutional: Negative for chills and fever.  Respiratory: Negative for cough and shortness of breath.   Cardiovascular: Negative for chest pain.  Gastrointestinal: Negative for abdominal pain.  Musculoskeletal: Positive for arthralgias. Negative for back pain, joint swelling and neck pain.  Skin: Negative for color change and rash.  Neurological: Negative for weakness, numbness and headaches.    Physical Exam Updated Vital Signs BP 116/76 (BP Location: Left Arm)   Pulse 67   Temp 98.8 F (37.1 C) (Oral)   Resp 16   SpO2 98%   Physical Exam Vitals and nursing note reviewed.  Constitutional:      General: He is not in acute distress.  Appearance: Normal appearance. He is well-developed. He is not ill-appearing or diaphoretic.  HENT:     Head: Normocephalic and atraumatic.     Comments: No evidence of head trauma, no hematoma, step-off or deformity, negative battle sign, there is a small abrasion to the right cheek but no underlying swelling or bony tenderness, no malocclusion of the jaw    Mouth/Throat:     Mouth: Mucous membranes are moist.     Pharynx: Oropharynx is clear.  Eyes:     General:        Right eye: No discharge.        Left eye: No discharge.  Neck:     Comments: No midline C-spine tenderness, full range of motion Cardiovascular:     Rate and Rhythm: Normal rate and regular rhythm.     Heart  sounds: Normal heart sounds. No murmur heard.  No friction rub. No gallop.   Pulmonary:     Effort: Pulmonary effort is normal. No respiratory distress.     Breath sounds: Normal breath sounds.     Comments: Response are present and equal bilaterally, there is minimal tenderness over the right lateral ribs, no palpable tenderness, crepitus or ecchymosis. Chest:     Chest wall: Tenderness present.  Abdominal:     General: Abdomen is flat. Bowel sounds are normal. There is no distension.     Palpations: Abdomen is soft. There is no mass.     Tenderness: There is no abdominal tenderness. There is no guarding.  Musculoskeletal:        General: Tenderness present.     Cervical back: Normal range of motion and neck supple. No tenderness.     Comments: There is tenderness over the right clavicle and right shoulder, no skin tenting over the clavicle there is deformity noted to the proximal end of the clavicle, no overlying ecchymosis, no focal bony tenderness over the shoulder and range of motion is intact.  Distal pulses 2+, normal strength and sensation. All the joints supple and easily movable, all compartments soft.  No midline thoracic or lumbar spine tenderness.  Neurological:     Mental Status: He is alert.     Coordination: Coordination normal.  Psychiatric:        Behavior: Behavior normal.     ED Results / Procedures / Treatments   Labs (all labs ordered are listed, but only abnormal results are displayed) Labs Reviewed - No data to display  EKG None  Radiology DG Ribs Unilateral W/Chest Right  Result Date: 08/24/2020 CLINICAL DATA:  Fall from ladder EXAM: RIGHT RIBS AND CHEST - 3+ VIEW COMPARISON:  03/26/2016 FINDINGS: Acute fracture involving the proximal third of the right clavicle with 5 mm of inferior displacement. No fracture or other bone lesions are seen involving the ribs. There is no evidence of pneumothorax or pleural effusion. Both lungs are clear. Heart size and  mediastinal contours are within normal limits. IMPRESSION: 1. Acute fracture involving the proximal third of the right clavicle with 5 mm of inferior displacement. 2. No right-sided rib fracture identified. Electronically Signed   By: Duanne Guess D.O.   On: 08/24/2020 11:55   DG Clavicle Right  Result Date: 08/24/2020 CLINICAL DATA:  Fall from ladder EXAM: RIGHT CLAVICLE - 2+ VIEWS COMPARISON:  None. FINDINGS: Acute mildly displaced fracture involving the proximal third of the right clavicle with approximately 7 mm of inferior displacement. No significant angulation. Alignment at the sternoclavicular and acromioclavicular joints appear maintained. IMPRESSION:  Acute mildly displaced fracture involving the proximal third of the right clavicle. Electronically Signed   By: Duanne Guess D.O.   On: 08/24/2020 11:56   DG Shoulder Right  Result Date: 08/24/2020 CLINICAL DATA:  Fall, pain. EXAM: RIGHT SHOULDER - 2+ VIEW COMPARISON:  None. FINDINGS: Acute right clavicle fracture, further characterized on dedicated clavicular radiographs. Otherwise, there is no evidence of fracture or dislocation. There is no evidence of arthropathy or other focal bone abnormality. Soft tissues are unremarkable. IMPRESSION: Acute right clavicle fracture, further characterized on dedicated clavicular radiographs. Otherwise, no acute fracture. Electronically Signed   By: Feliberto Harts MD   On: 08/24/2020 11:55    Procedures Procedures (including critical care time)  Medications Ordered in ED Medications  ibuprofen (ADVIL) tablet 600 mg (has no administration in time range)    ED Course  I have reviewed the triage vital signs and the nursing notes.  Pertinent labs & imaging results that were available during my care of the patient were reviewed by me and considered in my medical decision making (see chart for details).    MDM Rules/Calculators/A&P                         49 year old male presents after he  fell from a ladder, he slid down the ladder falling on his right shoulder and ribs.  Did not hit his head, has no midline spinal tenderness.  Focal pain over the shoulder clavicle and some very mild tenderness over the right ribs without palpable deformity, bruising or crepitus.  X-rays ordered from triage of the right shoulder, clavicle and right chest and ribs.  X-ray significant for acute mildly displaced fracture of the proximal end of the clavicle, 5-7 mm of inferior displacement noted, on exam patient has no skin tenting or discoloration over the skin.  He has 2+ distal pulses and normal sensation and strength and normal range of motion of the shoulder.  Will place patient in shoulder immobilizer and have him follow-up with orthopedics.  He reports pain is very mild, will have him use Motrin, Tylenol and ice for pain relief.  He has been given information for Dr. Eulah Pont for orthopedic follow-up, return precautions discussed.  Discharged home in good condition.  Final Clinical Impression(s) / ED Diagnoses Final diagnoses:  Fall, initial encounter  Closed posterior displaced fracture of sternal end of right clavicle, initial encounter    Rx / DC Orders ED Discharge Orders    None       Legrand Rams 08/24/20 1322    Eber Hong, MD 08/25/20 [phone]

## 2020-08-24 NOTE — Progress Notes (Signed)
Orthopedic Tech Progress Note Patient Details:  Samuel Dennis 1971/08/19 [id number]  Ortho Devices Type of Ortho Device: Sling immobilizer Ortho Device/Splint Location: Right Arm Ortho Device/Splint Interventions: Ordered, Application   Post Interventions Patient Tolerated: Well Instructions Provided: Care of device, Adjustment of device   Samuel Dennis 08/24/2020, 12:51 PM

## 2020-08-24 NOTE — ED Triage Notes (Signed)
Pt fell from a ladder about 10ft up ladder slid and pt feel onto right shoulder and rib area, denies any LOC. Denies any blood thinners, pt is ambulatory alert and ox4. Denies any SOB or CP

## 2020-11-11 ENCOUNTER — Other Ambulatory Visit: Payer: Self-pay

## 2020-11-11 ENCOUNTER — Ambulatory Visit: Payer: Self-pay | Admitting: Family Medicine

## 2020-11-11 ENCOUNTER — Ambulatory Visit (HOSPITAL_COMMUNITY)
Admission: EM | Admit: 2020-11-11 | Discharge: 2020-11-11 | Disposition: A | Discharge status: Home / Self Care | Payer: HRSA Program | Attending: Urgent Care | Admitting: Urgent Care

## 2020-11-11 DIAGNOSIS — R42 Dizziness and giddiness: Secondary | ICD-10-CM

## 2020-11-11 DIAGNOSIS — Z79899 Other long term (current) drug therapy: Secondary | ICD-10-CM | POA: Insufficient documentation

## 2020-11-11 DIAGNOSIS — U071 COVID-19: Secondary | ICD-10-CM | POA: Diagnosis not present

## 2020-11-11 DIAGNOSIS — R110 Nausea: Secondary | ICD-10-CM

## 2020-11-11 DIAGNOSIS — R5383 Other fatigue: Secondary | ICD-10-CM

## 2020-11-11 DIAGNOSIS — R112 Nausea with vomiting, unspecified: Secondary | ICD-10-CM

## 2020-11-11 DIAGNOSIS — B349 Viral infection, unspecified: Secondary | ICD-10-CM

## 2020-11-11 LAB — SARS CORONAVIRUS 2 (TAT 6-24 HRS): SARS Coronavirus 2: POSITIVE — AB

## 2020-11-11 LAB — CBG MONITORING, ED: Glucose-Capillary: 100 mg/dL — ABNORMAL HIGH (ref 70–99)

## 2020-11-11 MED ORDER — ONDANSETRON 8 MG PO TBDP: 8.0000 mg | ORAL_TABLET | Freq: Three times a day (TID) | ORAL | 0 refills | Status: DC | PRN

## 2020-11-11 MED ORDER — BENZONATATE 100 MG PO CAPS
100.0000 mg | ORAL_CAPSULE | Freq: Three times a day (TID) | ORAL | 0 refills | Status: DC | PRN
Start: 2020-11-11 — End: 2023-07-07

## 2020-11-11 MED ORDER — PROMETHAZINE-DM 6.25-15 MG/5ML PO SYRP: 5.0000 mL | ORAL_SOLUTION | Freq: Every evening | ORAL | 0 refills | Status: DC | PRN

## 2020-11-11 NOTE — ED Provider Notes (Signed)
[facility]   MRN: [id number] DOB: Mar 07, 1971  Subjective:   Samuel Dennis is a 50 y.o. male presenting for nausea, vomiting, malaise and fatigue.  Symptoms started yesterday after he ate at a new restaurant, was a burger place that is not used to eating.  States that he felt sick to his stomach immediately after and threw up just outside the restaurant.  He is also started to feel little dizzy and woozy.  Has a family history of diabetes.  Is also had a cough but believes it is more chronic.  His wife is also been coughing.  Denies fever, headache, confusion, sore throat, chest pain, shortness of breath, abdominal pain, diarrhea, bloody stools.  Patient is COVID vaccinated but no booster.  No recent antibiotic courses, hospitalizations or sick contacts.  No current facility-administered medications for this encounter.  Current Outpatient Medications:  .  clonazePAM (KLONOPIN) 0.5 MG tablet, Take 1 tablet (0.5 mg total) by mouth at bedtime as needed for anxiety., Disp: 30 tablet, Rfl: 0 .  cyclobenzaprine (FLEXERIL) 10 MG tablet, Take 1 tablet (10 mg total) by mouth at bedtime., Disp: 20 tablet, Rfl: 0 .  FLUoxetine (PROZAC) 40 MG capsule, Take 1 capsule (40 mg total) by mouth daily., Disp: 90 capsule, Rfl: 1 .  traZODone (DESYREL) 50 MG tablet, Take 1 tablet (50 mg total) by mouth at bedtime as needed for sleep., Disp: 30 tablet, Rfl: 2   No Known Allergies  Past Medical History:  Diagnosis Date  . Depression   . Neuromuscular disorder (HCC)      No past surgical history on file.  Family History  Problem Relation Age of Onset  . Diabetes Mother     Social History   Tobacco Use  . Smoking status: Never Smoker  . Smokeless tobacco: Never Used  Substance Use Topics  . Alcohol use: No    Alcohol/week: 0.0 standard drinks  . Drug use: No    ROS   Objective:   Vitals: BP 130/72   Pulse 65   Temp 98.4 F (36.9 C) (Oral)   Resp 16   SpO2 100%    Physical Exam Constitutional:      General: He is not in acute distress.    Appearance: Normal appearance. He is well-developed and well-nourished. He is not ill-appearing, toxic-appearing or diaphoretic.  HENT:     Head: Normocephalic and atraumatic.     Right Ear: External ear normal.     Left Ear: External ear normal.     Nose: Nose normal.     Mouth/Throat:     Mouth: Oropharynx is clear and moist. Mucous membranes are moist.     Pharynx: Oropharynx is clear.  Eyes:     General: No scleral icterus.       Right eye: No discharge.        Left eye: No discharge.     Extraocular Movements: Extraocular movements intact.     Conjunctiva/sclera: Conjunctivae normal.     Pupils: Pupils are equal, round, and reactive to light.  Cardiovascular:     Rate and Rhythm: Normal rate and regular rhythm.     Pulses: Intact distal pulses.     Heart sounds: Normal heart sounds. No murmur heard. No friction rub. No gallop.   Pulmonary:     Effort: Pulmonary effort is normal. No respiratory distress.     Breath sounds: Normal breath sounds. No stridor. No wheezing, rhonchi or rales.  Abdominal:  General: Bowel sounds are normal. There is no distension.     Palpations: Abdomen is soft. There is no mass.     Tenderness: There is no abdominal tenderness. There is no right CVA tenderness, left CVA tenderness, guarding or rebound.  Skin:    General: Skin is warm and dry.  Neurological:     Mental Status: He is alert and oriented to person, place, and time.  Psychiatric:        Mood and Affect: Mood and affect and mood normal.        Behavior: Behavior normal.        Thought Content: Thought content normal.        Judgment: Judgment normal.    Results for orders placed or performed during the hospital encounter of 11/11/20 (from the past 24 hour(s))  POC CBG monitoring     Status: Abnormal   Collection Time: 11/11/20 10:38 AM  Result Value Ref Range   Glucose-Capillary 100 (H) 70 - 99  mg/dL    ED ECG REPORT   Date: 11/11/2020  Rate: 66bpm  Rhythm: normal sinus rhythm  QRS Axis: normal  Intervals: normal  ST/T Wave abnormalities: normal  Conduction Disutrbances:none  Narrative Interpretation: Sinus rhythm at 66 bpm.  Very comparable to previous EKG from 2016.  Old EKG Reviewed: unchanged  I have personally reviewed the EKG tracing and agree with the computerized printout as noted.  Assessment and Plan :   PDMP not reviewed this encounter.  1. Viral syndrome   2. Nausea and vomiting, intractability of vomiting not specified, unspecified vomiting type   3. Dizziness   4. Fatigue, unspecified type     COVID-19 testing pending. Recommended supportive care for what I suspect is a viral syndrome. POC testing is reassuring, patient has normal cardiopulmonary exam and stable vital signs. Counseled patient on potential for adverse effects with medications prescribed/recommended today, ER and return-to-clinic precautions discussed, patient verbalized understanding.    Wallis Bamberg, PA-C 11/11/20 1058

## 2020-11-11 NOTE — ED Triage Notes (Signed)
Pt reports vomiting and fatigue after eating a burger from a new place. Pt reports his wife has also been coughing .

## 2020-11-11 NOTE — Discharge Instructions (Signed)
Para el dolor de garganta o tos puede usar un t de miel. Use 3 cucharaditas de miel con jugo exprimido de [employer]. Coloque trozos de [employer] en 1/2-1 taza de agua y caliente sobre la estufa. Luego mezcle los ingredientes y repita cada 4 horas. Para fiebre, dolores de cuerpo tome ibuprofeno 400mg -600mg  con comida cada 6 horas alternando con o junto con Tylenol 500mg -650mg  cada 6 horas. Hidrata muy bien con al menos 2 litros (64 onzas) de agua al dia. Coma comidas ligeras como sopas para y nutricion. Tambien puede tomar suero. Comience un antihistamnico como Zyrtec (cetirizina) 10mg  al dia. Puede usar pseudoefedrina (Sudafed) de venta libre para el goteo posnasal, congestin a una dosis de 60 mg cada 8 horas o cada 12 horas. Puede usar ondansetron para nausea y vomito. Use el jarabe por la noche para su tos y las capsulas durante el dia.

## 2020-11-12 ENCOUNTER — Telehealth: Payer: Self-pay | Admitting: Infectious Diseases

## 2020-11-12 NOTE — Telephone Encounter (Signed)
Called to discuss with patient about COVID-19 symptoms and the use of one of the available treatments for those with mild to moderate Covid symptoms and at a high risk of hospitalization.  Pt appears to qualify for outpatient treatment due to co-morbid conditions and/or a member of an at-risk group in accordance with the FDA Emergency Use Authorization.    Symptom onset: 2/22 Vaccinated: yes Booster? no Immunocompromised? no Qualifiers: bmi > 25  [name], [place]   [id number], [name] used to leave a message for him if he is interested in treatment.

## 2021-06-24 ENCOUNTER — Encounter: Payer: Self-pay | Admitting: Family Medicine

## 2021-12-21 ENCOUNTER — Encounter: Payer: Self-pay | Admitting: Registered Nurse

## 2022-10-15 IMAGING — DX DG SHOULDER 2+V*R*
2 series · 2 of 2 positions shown · non-contrast
Comparison: None.

CLINICAL DATA: Fall, pain.

EXAM:
RIGHT SHOULDER - 2+ VIEW

[shoulder grashey]
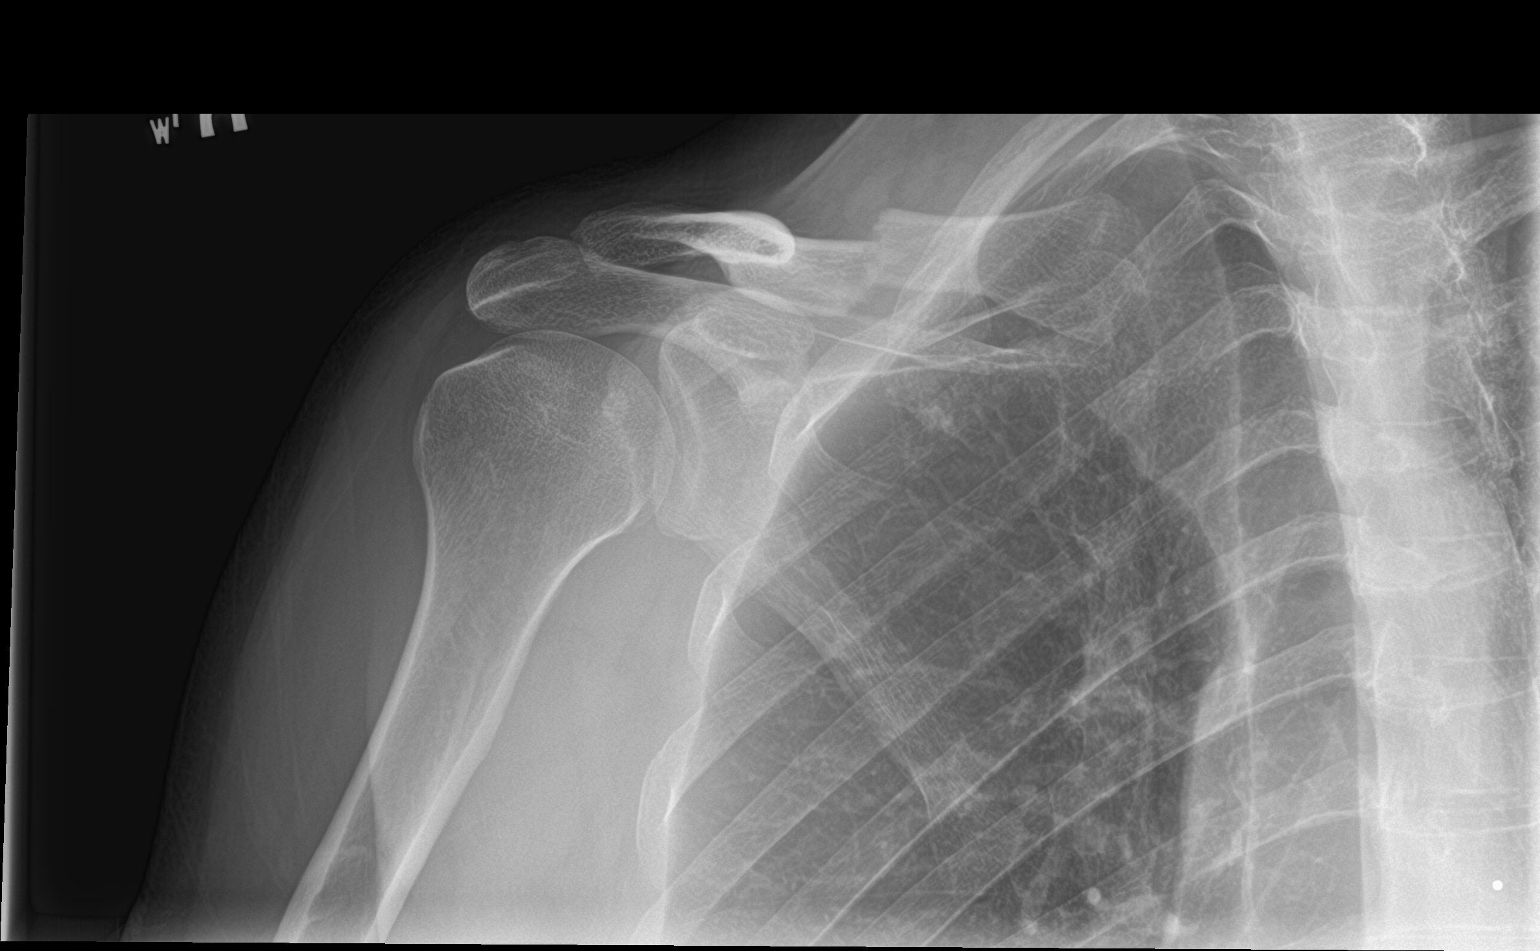

[shoulder y view]
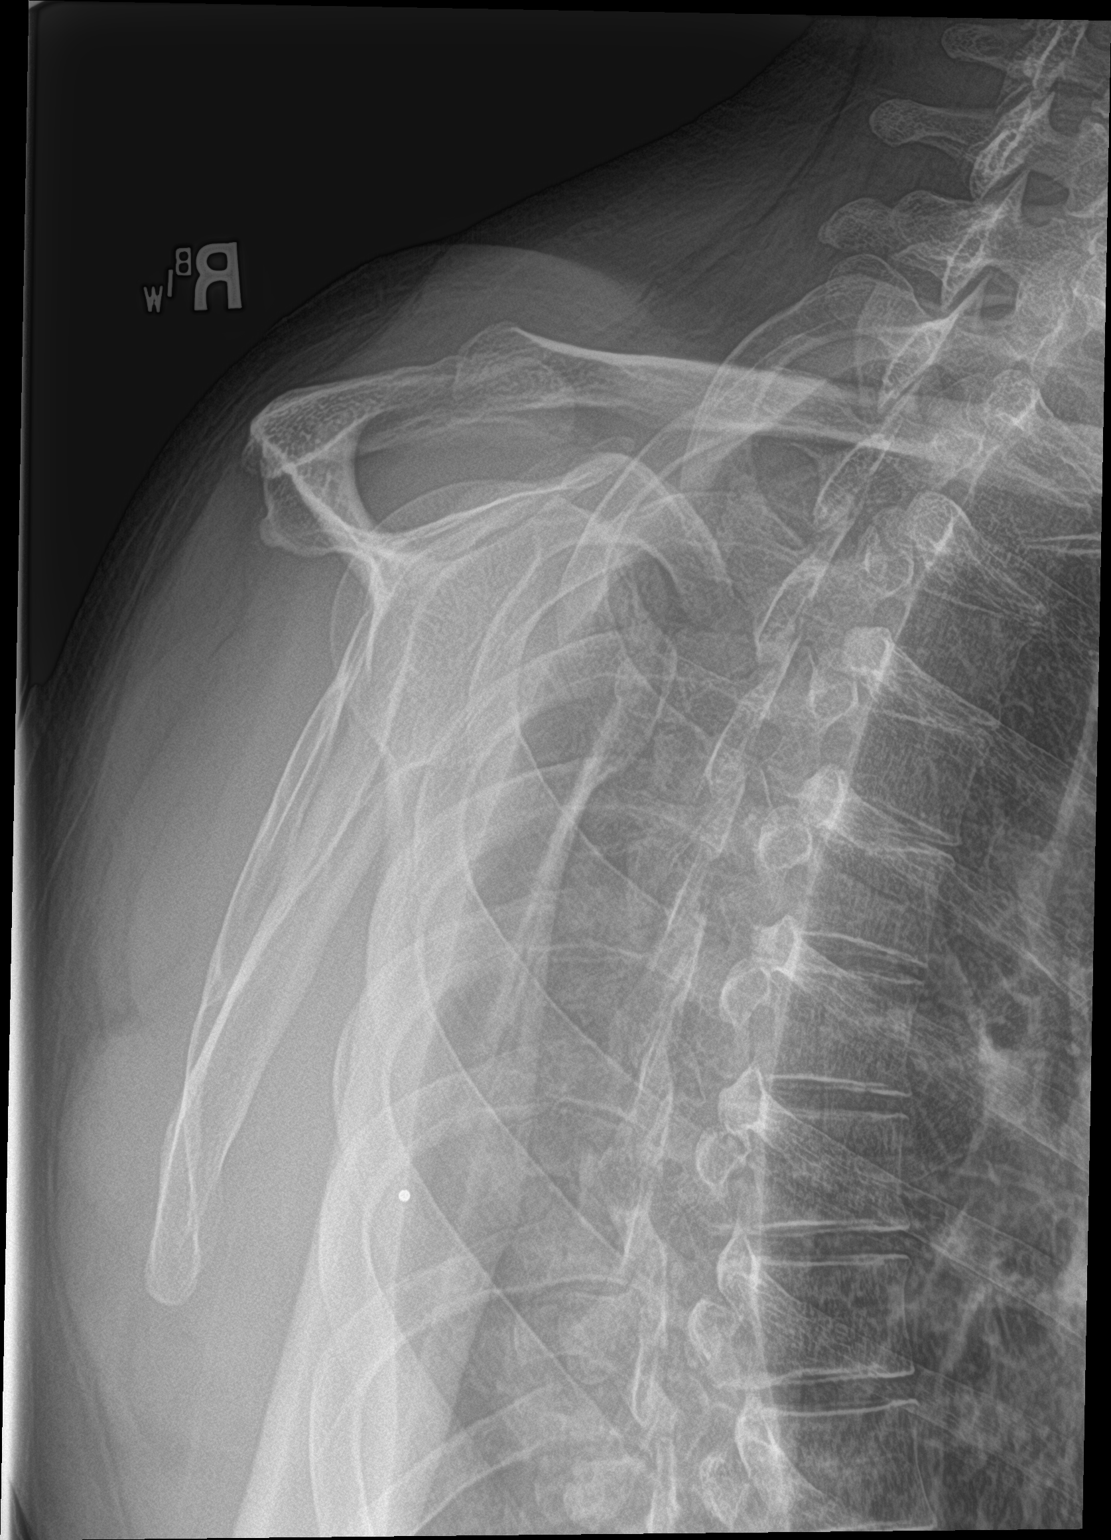

[2 of 2 positions shown; findings below may reference images not displayed]

FINDINGS: Acute right clavicle fracture, further characterized on dedicated
clavicular radiographs. Otherwise, there is no evidence of fracture
or dislocation. There is no evidence of arthropathy or other focal
bone abnormality. Soft tissues are unremarkable.
IMPRESSION: Acute right clavicle fracture, further characterized on dedicated
clavicular radiographs. Otherwise, no acute fracture.

## 2023-06-01 ENCOUNTER — Telehealth: Payer: Self-pay | Admitting: General Practice

## 2023-06-01 NOTE — Telephone Encounter (Signed)
Caller name: Romie Morrow  On DPR?: Yes  Call back number: [phone] (mobile)  Provider they see: Patient, [name]  Reason for call:   Pt is spouse of Tukes,C.Pt and would like to Est care - advise

## 2023-06-01 NOTE — Telephone Encounter (Signed)
This person is requesting to be accepted as your patient, their spouse is your patient currently, MRN: [id number]

## 2023-06-01 NOTE — Telephone Encounter (Signed)
Please schedule establish care and mark the need for interpreter

## 2023-06-01 NOTE — Telephone Encounter (Signed)
I will see him for establish care visit but make sure he is scheduled with interpreter.  Thanks.

## 2023-06-06 NOTE — Telephone Encounter (Signed)
I called pt via our interpreter service to make his appt. It went straight to VM. I let her to to tell him we'd be using the service to call him back wen he calls back.

## 2023-07-07 ENCOUNTER — Telehealth: Payer: Self-pay | Admitting: General Practice

## 2023-07-07 ENCOUNTER — Ambulatory Visit (INDEPENDENT_AMBULATORY_CARE_PROVIDER_SITE_OTHER): Payer: Self-pay | Admitting: Family Medicine

## 2023-07-07 ENCOUNTER — Encounter: Payer: Self-pay | Admitting: Family Medicine

## 2023-07-07 VITALS — BP 114/80 | HR 64 | Temp 98.2°F | Ht 64.5 in | Wt 160.4 lb

## 2023-07-07 DIAGNOSIS — R42 Dizziness and giddiness: Secondary | ICD-10-CM

## 2023-07-07 DIAGNOSIS — Z7689 Persons encountering health services in other specified circumstances: Secondary | ICD-10-CM

## 2023-07-07 DIAGNOSIS — F419 Anxiety disorder, unspecified: Secondary | ICD-10-CM

## 2023-07-07 DIAGNOSIS — G4700 Insomnia, unspecified: Secondary | ICD-10-CM

## 2023-07-07 MED ORDER — HYDROXYZINE PAMOATE 25 MG PO CAPS: ORAL_CAPSULE | ORAL | 0 refills | Status: DC

## 2023-07-07 NOTE — Patient Instructions (Addendum)
-  It was a pleasure to meet you today and look forward to taking care of you.  -START Hydroxyzine 25mg  tablet, take 1-2 tablets at bedtime, 30-45 minutes before going to bed for anxiety and insomnia. Will follow up in 2 weeks to see how medications is working. -If you develop anymore dizziness, please follow up sooner. -Please be fasting at your next appointment to have labs drawn. Fasting means only water or black coffee 6 hours prior to lab visit.

## 2023-07-07 NOTE — Telephone Encounter (Signed)
Pt states his wife and child are pts here and would like to know if he can be a pt here as well. Please advise.

## 2023-07-07 NOTE — Telephone Encounter (Signed)
Yes, I will accept him as new patient.  Okay to schedule new patient visit.  Thanks

## 2023-07-07 NOTE — Telephone Encounter (Signed)
Do we need to send the curtesy message asking if Samuel Dennis is also okay with this transfer as this is standard TOC procedure or do we proceed given the circumstances?

## 2023-07-07 NOTE — Telephone Encounter (Signed)
Samuel Dennis' MRN: [id number]  Patient established with Samuel Dennis so his family could all be seen in the same location but Samuel Dennis has moved so patient would like you to take him on as patient.  Please advise

## 2023-07-07 NOTE — Progress Notes (Signed)
New Patient Office Visit  Subjective    Patient ID: Samuel Dennis, male    DOB: 11/29/1970  Age: 52 y.o. MRN: [id number]  CC:  Chief Complaint  Patient presents with   Establish Care    Had dizziness about 2 wks ago, now is feeling feeling okay. Meds for depression  Just would like to est care    HPI Samuel Dennis presents to establish care with new provider.   Patients previous primary care provider was [facility] providers. Last visit was 08/2018.   Specialist: None   Patient reports he had dizziness 2 weeks ago, but it has resolved. He reports he does not sleep well at night and concerned it could have been form not sleeping. He reports dizziness once when bending over at work and had other dizzy episodes lasted for a minutes. Denies chest pain or shortness of breath. Reports nausea with the dizziness. Denies having a history of dizziness.   Depression/Anxiety/Insomnia: Chronic. Patient was previously prescribed Prozac 40mg  daily. He is unsure if the medication was effective or not. He is uncertain the reason for stopping medication. Based on previous records, patient was has been on Prozac and Zoloft previously. Also, been prescribed Ativan and Clonazepam for anxiety and Trazodone for insomnia. He reports his anxiety is mostly at night time. He reports he is busy during the day. He reports he usually sleeps 3-4 hours. He reports he typically goes to bed at 10 pm and sometimes does not fall asleep until midnight or later. Patient scored 7 on his PHQ-9 screening and scored 5 on GAD-7 screening.   Outpatient Encounter Medications as of 07/07/2023  Medication Sig   hydrOXYzine (VISTARIL) 25 MG capsule Take 1-2 tablets at bedtime as needed for insomnia and anxiety.   [DISCONTINUED] benzonatate (TESSALON) 100 MG capsule Take 1-2 capsules (100-200 mg total) by mouth 3 (three) times daily as needed for cough. (Patient not taking: Reported on 07/07/2023)    [DISCONTINUED] clonazePAM (KLONOPIN) 0.5 MG tablet Take 1 tablet (0.5 mg total) by mouth at bedtime as needed for anxiety. (Patient not taking: Reported on 07/07/2023)   [DISCONTINUED] cyclobenzaprine (FLEXERIL) 10 MG tablet Take 1 tablet (10 mg total) by mouth at bedtime. (Patient not taking: Reported on 07/07/2023)   [DISCONTINUED] FLUoxetine (PROZAC) 40 MG capsule Take 1 capsule (40 mg total) by mouth daily. (Patient not taking: Reported on 07/07/2023)   [DISCONTINUED] ondansetron (ZOFRAN-ODT) 8 MG disintegrating tablet Take 1 tablet (8 mg total) by mouth every 8 (eight) hours as needed for nausea or vomiting. (Patient not taking: Reported on 07/07/2023)   [DISCONTINUED] promethazine-dextromethorphan (PROMETHAZINE-DM) 6.25-15 MG/5ML syrup Take 5 mLs by mouth at bedtime as needed for cough. (Patient not taking: Reported on 07/07/2023)   [DISCONTINUED] traZODone (DESYREL) 50 MG tablet Take 1 tablet (50 mg total) by mouth at bedtime as needed for sleep. (Patient not taking: Reported on 07/07/2023)   No facility-administered encounter medications on file as of 07/07/2023.    Past Medical History:  Diagnosis Date   Depression    Neuromuscular disorder (HCC)     History reviewed. No pertinent surgical history.  Family History  Problem Relation Age of Onset   Diabetes Mother    Cancer Sister        Bladder   Cancer Sister        Nose    Social History   Socioeconomic History   Marital status: Married    Spouse name: Not on file   Number of  children: 3   Years of education: Not on file   Highest education level: 10th grade  Occupational History   Not on file  Tobacco Use   Smoking status: Never   Smokeless tobacco: Never  Vaping Use   Vaping status: Never Used  Substance and Sexual Activity   Alcohol use: No    Alcohol/week: 0.0 standard drinks of alcohol   Drug use: No   Sexual activity: Not on file  Other Topics Concern   Not on file  Social History Narrative   Not on  file   Social Determinants of Health   Financial Resource Strain: Low Risk  (07/07/2023)   Overall Financial Resource Strain (CARDIA)    Difficulty of Paying Living Expenses: Not very hard  Food Insecurity: No Food Insecurity (07/07/2023)   Hunger Vital Sign    Worried About Running Out of Food in the Last Year: Never true    Ran Out of Food in the Last Year: Never true  Transportation Needs: No Transportation Needs (07/07/2023)   PRAPARE - Administrator, Civil Service (Medical): No    Lack of Transportation (Non-Medical): No  Physical Activity: Sufficiently Active (07/07/2023)   Exercise Vital Sign    Days of Exercise per Week: 6 days    Minutes of Exercise per Session: 60 min  Stress: Stress Concern Present (07/07/2023)   [employer] of Occupational Health - Occupational Stress Questionnaire    Feeling of Stress : To some extent  Social Connections: Moderately Integrated (07/07/2023)   Social Connection and Isolation Panel [NHANES]    Frequency of Communication with Friends and Family: Three times a week    Frequency of Social Gatherings with Friends and Family: More than three times a week    Attends Religious Services: More than 4 times per year    Active Member of [employer] or Organizations: No    Attends Banker Meetings: Never    Marital Status: Married  Catering manager Violence: Not At Risk (07/07/2023)   Humiliation, Afraid, Rape, and Kick questionnaire    Fear of Current or Ex-Partner: No    Emotionally Abused: No    Physically Abused: No    Sexually Abused: No    ROS See HPI above    Objective    BP 114/80 (BP Location: Left Arm, Patient Position: Sitting, Cuff Size: Normal)   Pulse 64   Temp 98.2 F (36.8 C) (Oral)   Ht 5' 4.5" (1.638 m)   Wt 160 lb 6.4 oz (72.8 kg)   SpO2 98%   BMI 27.11 kg/m   Physical Exam Vitals reviewed.  Constitutional:      General: He is not in acute distress.    Appearance: Normal appearance. He  is overweight. He is not ill-appearing, toxic-appearing or diaphoretic.  HENT:     Head: Normocephalic and atraumatic.  Eyes:     General:        Right eye: No discharge.        Left eye: No discharge.     Conjunctiva/sclera: Conjunctivae normal.  Cardiovascular:     Rate and Rhythm: Normal rate and regular rhythm.     Heart sounds: Normal heart sounds. No murmur heard.    No friction rub. No gallop.  Pulmonary:     Effort: Pulmonary effort is normal. No respiratory distress.     Breath sounds: Normal breath sounds.  Musculoskeletal:        General: Normal range of motion.  Skin:  General: Skin is warm and dry.  Neurological:     General: No focal deficit present.     Mental Status: He is alert and oriented to person, place, and time. Mental status is at baseline.     Motor: No weakness.     Gait: Gait normal.  Psychiatric:        Attention and Perception: Attention normal.        Mood and Affect: Mood normal.        Speech: Speech normal.        Behavior: Behavior normal. Behavior is cooperative.        Thought Content: Thought content normal.        Cognition and Memory: Cognition normal.        Judgment: Judgment normal.      Assessment & Plan:  Anxiety -     hydrOXYzine Pamoate; Take 1-2 tablets at bedtime as needed for insomnia and anxiety.  Dispense: 30 capsule; Refill: 0  Insomnia, unspecified type -     hydrOXYzine Pamoate; Take 1-2 tablets at bedtime as needed for insomnia and anxiety.  Dispense: 30 capsule; Refill: 0  Dizziness  Encounter to establish care   1.Review health maintenance:  -Colonoscpy or cologuard: Declines both -Covid booster: Declines -Influenza vaccine: Declines -Tdap vaccine: Will check [place] tracks  -HIV and Hep C screening: Will obtain at next visit  -Zoster vaccine: Declines  2.Prescribed Hydroxyzine 25mg  tablet, take 1-2 tablets at bedtime, 30-45 minutes before going to bed for anxiety and insomnia. Will follow up in 2 weeks to see  how medications is working. 3. Advised he develops anymore dizziness, please follow up sooner. Blood pressure is stable today with no obvious neurological abnormalities and heart sounds stable.  4.Advised to be fasting for labs at next visit in 2 weeks. 5.Spanish interpreter, Elane Fritz, with [employer] was present for complete visit with CMA and provider.  Return in about 2 weeks (around 07/21/2023) for follow-up.   Zandra Abts, NP

## 2023-07-13 NOTE — Telephone Encounter (Signed)
Pt scheduled for 08/09/23 @2 :40 pt plans to fast so lab work can be done per plan with Mardene Celeste prior to switch

## 2023-07-13 NOTE — Telephone Encounter (Signed)
This patient is seeking to change from under your care to under Dr Paralee Cancel care as the rest of his family is already seeing Dr Neva Seat.  Dr Neva Seat is okay with taking in this patient if you are okay with this as well.  Please advise

## 2023-07-14 NOTE — Telephone Encounter (Signed)
Pt has appt 08/09/23

## 2023-07-21 ENCOUNTER — Ambulatory Visit: Payer: Self-pay | Admitting: Family Medicine

## 2023-08-09 ENCOUNTER — Encounter: Payer: Self-pay | Admitting: Family Medicine

## 2023-08-09 ENCOUNTER — Ambulatory Visit (INDEPENDENT_AMBULATORY_CARE_PROVIDER_SITE_OTHER): Payer: Self-pay | Admitting: Family Medicine

## 2023-08-09 VITALS — BP 112/76 | HR 94 | Temp 98.7°F | Ht 64.5 in | Wt 158.6 lb

## 2023-08-09 DIAGNOSIS — Z0000 Encounter for general adult medical examination without abnormal findings: Secondary | ICD-10-CM

## 2023-08-09 DIAGNOSIS — Z1329 Encounter for screening for other suspected endocrine disorder: Secondary | ICD-10-CM

## 2023-08-09 DIAGNOSIS — Z1159 Encounter for screening for other viral diseases: Secondary | ICD-10-CM

## 2023-08-09 DIAGNOSIS — Z131 Encounter for screening for diabetes mellitus: Secondary | ICD-10-CM

## 2023-08-09 DIAGNOSIS — Z13220 Encounter for screening for lipoid disorders: Secondary | ICD-10-CM

## 2023-08-09 DIAGNOSIS — F419 Anxiety disorder, unspecified: Secondary | ICD-10-CM | POA: Insufficient documentation

## 2023-08-09 DIAGNOSIS — Z1211 Encounter for screening for malignant neoplasm of colon: Secondary | ICD-10-CM

## 2023-08-09 DIAGNOSIS — Z125 Encounter for screening for malignant neoplasm of prostate: Secondary | ICD-10-CM

## 2023-08-09 DIAGNOSIS — Z114 Encounter for screening for human immunodeficiency virus [HIV]: Secondary | ICD-10-CM

## 2023-08-09 DIAGNOSIS — Z23 Encounter for immunization: Secondary | ICD-10-CM

## 2023-08-09 DIAGNOSIS — G4700 Insomnia, unspecified: Secondary | ICD-10-CM | POA: Insufficient documentation

## 2023-08-09 MED ORDER — SERTRALINE HCL 25 MG PO TABS: 25.0000 mg | ORAL_TABLET | Freq: Every day | ORAL | 3 refills | Status: DC

## 2023-08-09 NOTE — Progress Notes (Signed)
Subjective:  Patient ID: Samuel Dennis, male    DOB: 1971-07-17  Age: 52 y.o. MRN: [id number]  CC:  Chief Complaint  Patient presents with   New Patient (Initial Visit)    Pt notes thinks he is due for some labs otherwise is doing well     HPI Samuel Dennis presents for Annual Exam - New patient to establish with me as primary care provider.  I also care for his family member.  Presents with Spanish interpreter in person. Requested English spoken with understanding expressed, few times during visit required of interpreter clarification.  Last seen October 18 by Zandra Abts, with establish care visit at that time. Prior in 2019 at [facility]   Anxiety/depression/insomnia Her October 18 visit, had been on Prozac, Zoloft previously as well as Ativan or clonazepam for anxiety and trazodone for insomnia.  Reported persistent anxiety, had sleeping only 3 to 4 hours.  Difficulty getting to sleep for few hours.  Hydroxyzine was prescribed, 25 mg 1-2 at bedtime.  2-week follow-up planned.  Taking hydroxyzine 5 times per week - 25mg . Has been helping with sleep. Prior to use of hydroxyzine - few hours. Now 4-5 hours per night. Feels rested.  Feeling less depression. No personal or FH of Bipolar disorder. No manic symptoms, no alcohol or drug use.  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  No psychiatric hospitalizations. Sleep was main issue in past, other meds did not seem to help.   No other medical problems, no surgeries.   HM: No FH of colon CA, no personal hx of polyps, bleeding. Screening options with colonoscopy versus Cologuard discussed. Discussed timing of repeat testing intervals if normal, as well as potential need for diagnostic Colonoscopy if positive Cologuard. Understanding expressed, and chose Cologuard.  Tdap today  - over 10 years.  Flu vaccine - declined.  Shingles - 1st dose today.  Covid booster - declined.  Hep C  screening - today.  Fasting today.   Immunization History  Administered Date(s) Administered   PFIZER(Purple Top)SARS-COV-2 Vaccination 12/12/2019, 01/03/2020        08/09/2023    2:26 PM 07/07/2023   11:17 AM  GAD 7 : Generalized Anxiety Score  Nervous, Anxious, on Edge 1 1  Control/stop worrying 0 1  Worry too much - different things 0 1  Trouble relaxing 1 2  Restless 0 0  Easily annoyed or irritable 1 0  Afraid - awful might happen 0 0  Total GAD 7 Score 3 5  Anxiety Difficulty  Somewhat difficult       08/09/2023    2:26 PM 07/07/2023   11:16 AM 08/25/2018   10:00 AM 02/27/2018    9:02 AM 10/05/2017   11:07 AM  Depression screen PHQ 2/9  Decreased Interest 0 0 0 0 0  Down, Depressed, Hopeless 0 2 0 0 3  PHQ - 2 Score 0 2 0 0 3  Altered sleeping 1 2   3   Tired, decreased energy 0 1   3  Change in appetite 0 0   1  Feeling bad or failure about yourself  0 1   2  Trouble concentrating 0 1   2  Moving slowly or fidgety/restless 0 0   2  Suicidal thoughts 0 0   1  PHQ-9 Score 1 7   17   Difficult doing work/chores  Somewhat difficult   Somewhat difficult       08/09/2023  2:26 PM 07/07/2023   11:16 AM 08/25/2018   10:00 AM 02/27/2018    9:02 AM 10/05/2017   11:07 AM  Depression screen PHQ 2/9  Decreased Interest 0 0 0 0 0  Down, Depressed, Hopeless 0 2 0 0 3  PHQ - 2 Score 0 2 0 0 3  Altered sleeping 1 2   3   Tired, decreased energy 0 1   3  Change in appetite 0 0   1  Feeling bad or failure about yourself  0 1   2  Trouble concentrating 0 1   2  Moving slowly or fidgety/restless 0 0   2  Suicidal thoughts 0 0   1  PHQ-9 Score 1 7   17   Difficult doing work/chores  Somewhat difficult   Somewhat difficult    Health Maintenance  Topic Date Due   HIV Screening  Never done   Hepatitis C Screening  Never done   DTaP/Tdap/Td (1 - Tdap) Never done   Colonoscopy  Never done   Zoster Vaccines- Shingrix (1 of 2) Never done   COVID-19 Vaccine (3 - 2023-24  season) 05/21/2023   INFLUENZA VACCINE  12/18/2023 (Originally 04/20/2023)   HPV VACCINES  Aged Out  Cologuard ordered.   Prostate: does not have family history of prostate cancer The natural history of prostate cancer and ongoing controversy regarding screening and potential treatment outcomes of prostate cancer has been discussed with the patient. The meaning of a false positive PSA and a false negative PSA has been discussed. He indicates understanding of the limitations of this screening test and wishes  to proceed with screening PSA testing. No results found for: "PSA1", "PSA"    Immunization History  Administered Date(s) Administered   PFIZER(Purple Top)SARS-COV-2 Vaccination 12/12/2019, 01/03/2020  See vaccine info above.   No results found. Reading glasses only. Optho 4 months ago.   Dental: every 6 months  Alcohol:none  Tobacco: none  Exercise:physical work.    History Patient Active Problem List   Diagnosis Date Noted   Lumbar pain 02/27/2018   Musculoskeletal pain 02/27/2018   Muscle spasm 02/27/2018   Past Medical History:  Diagnosis Date   Depression    Neuromuscular disorder (HCC)    History reviewed. No pertinent surgical history. No Known Allergies Prior to Admission medications   Medication Sig Start Date End Date Taking? Authorizing Provider  hydrOXYzine (VISTARIL) 25 MG capsule Take 1-2 tablets at bedtime as needed for insomnia and anxiety. 07/07/23  Yes Alveria Apley, NP   Social History   Socioeconomic History   Marital status: Married    Spouse name: Not on file   Number of children: 3   Years of education: Not on file   Highest education level: 10th grade  Occupational History   Not on file  Tobacco Use   Smoking status: Never   Smokeless tobacco: Never  Vaping Use   Vaping status: Never Used  Substance and Sexual Activity   Alcohol use: No    Alcohol/week: 0.0 standard drinks of alcohol   Drug use: No   Sexual activity: Yes   Other Topics Concern   Not on file  Social History Narrative   Not on file   Social Determinants of Health   Financial Resource Strain: Low Risk  (07/07/2023)   Overall Financial Resource Strain (CARDIA)    Difficulty of Paying Living Expenses: Not very hard  Food Insecurity: No Food Insecurity (07/07/2023)   Hunger Vital Sign  Worried About Programme researcher, broadcasting/film/video in the Last Year: Never true    Ran Out of Food in the Last Year: Never true  Transportation Needs: No Transportation Needs (07/07/2023)   PRAPARE - Administrator, Civil Service (Medical): No    Lack of Transportation (Non-Medical): No  Physical Activity: Sufficiently Active (07/07/2023)   Exercise Vital Sign    Days of Exercise per Week: 6 days    Minutes of Exercise per Session: 60 min  Stress: Stress Concern Present (07/07/2023)   [employer] of Occupational Health - Occupational Stress Questionnaire    Feeling of Stress : To some extent  Social Connections: Moderately Integrated (07/07/2023)   Social Connection and Isolation Panel [NHANES]    Frequency of Communication with Friends and Family: Three times a week    Frequency of Social Gatherings with Friends and Family: More than three times a week    Attends Religious Services: More than 4 times per year    Active Member of [employer] or Organizations: No    Attends Banker Meetings: Never    Marital Status: Married  Catering manager Violence: Not At Risk (07/07/2023)   Humiliation, Afraid, Rape, and Kick questionnaire    Fear of Current or Ex-Partner: No    Emotionally Abused: No    Physically Abused: No    Sexually Abused: No    Review of Systems 13 point review of systems per patient health survey noted.  Negative other than as indicated above or in HPI.    Objective:   Vitals:   08/09/23 1428  BP: 112/76  Pulse: 94  Temp: 98.7 F (37.1 C)  TempSrc: Temporal  SpO2: 96%  Weight: 158 lb 9.6 oz (71.9 kg)  Height: 5'  4.5" (1.638 m)     Physical Exam Vitals reviewed.  Constitutional:      Appearance: He is well-developed.  HENT:     Head: Normocephalic and atraumatic.     Right Ear: External ear normal.     Left Ear: External ear normal.  Eyes:     Conjunctiva/sclera: Conjunctivae normal.     Pupils: Pupils are equal, round, and reactive to light.  Neck:     Thyroid: No thyromegaly.  Cardiovascular:     Rate and Rhythm: Normal rate and regular rhythm.     Heart sounds: Normal heart sounds.  Pulmonary:     Effort: Pulmonary effort is normal. No respiratory distress.     Breath sounds: Normal breath sounds. No wheezing.  Abdominal:     General: There is no distension.     Palpations: Abdomen is soft.     Tenderness: There is no abdominal tenderness.  Musculoskeletal:        General: No tenderness. Normal range of motion.     Cervical back: Normal range of motion and neck supple.  Lymphadenopathy:     Cervical: No cervical adenopathy.  Skin:    General: Skin is warm and dry.  Neurological:     Mental Status: He is alert and oriented to person, place, and time.     Deep Tendon Reflexes: Reflexes are normal and symmetric.  Psychiatric:        Behavior: Behavior normal.     Assessment & Plan:  Samuel Dennis is a 52 y.o. male . Annual physical exam  - -anticipatory guidance as below in AVS, screening labs above. Health maintenance items as above in HPI discussed/recommended as applicable.   Anxiety - Plan: sertraline (ZOLOFT)  25 MG tablet, TSH Insomnia, unspecified type  -Symptoms have improved with use of hydroxyzine.  Suspect underlying anxiety plus or minus anxiety with depression and secondary insomnia.  Handout given on managing depression, managing anxiety and insomnia.  Significant improvement with hydroxyzine, will continue same but add low-dose sertraline to treat anxiety symptoms.  6-week follow-up.  Check TSH.  Screening for HIV (human immunodeficiency virus) - Plan: HIV  antibody (with reflex)   Need for hepatitis C screening test - Plan: Hepatitis C Antibody  Screen for colon cancer - Plan: Cologuard  Screening for diabetes mellitus - Plan: Comprehensive metabolic panel, Hemoglobin A1c  Screening for hyperlipidemia - Plan: Comprehensive metabolic panel, Lipid panel  Need for shingles vaccine - Plan: Varicella-zoster vaccine IM given, repeat in 2 to 6 months.  Need for Tdap vaccination - Plan: Tdap vaccine greater than or equal to 7yo IM  Screening for thyroid disorder - Plan: TSH  Screening for prostate cancer - Plan: PSA   Meds ordered this encounter  Medications   sertraline (ZOLOFT) 25 MG tablet    Sig: Take 1 tablet (25 mg total) by mouth daily.    Dispense:  30 tablet    Refill:  3   Patient Instructions  Start Zoloft once per day.  That can help depression and anxiety, and may also help with sleep.  Okay to continue same sleep medication hydroxyzine up to 2 pills, but you can always try taking just a half a pill to see if that is effective to help you get to sleep.  Use the lowest effective dose.  See information below on managing anxiety and depression, as well as insomnia.  I will check some labs today, but follow-up with me in 6 weeks and we can review those labs as well as anxiety, depression and sleep.  Take care!  Cuidados preventivos en los hombres de 40 a 64 aos de edad Preventive Care 37-90 Years Old, Male Los cuidados preventivos hacen referencia a las opciones en cuanto al estilo de vida y a las visitas al mdico, las cuales pueden promover la salud y Counsellor. Las visitas de cuidado preventivo tambin se denominan exmenes de Health visitor. Qu puedo esperar para mi visita de cuidado preventivo? Asesoramiento Durante la visita de cuidado preventivo, el mdico puede preguntarle sobre lo siguiente: Antecedentes mdicos, incluidos los siguientes: Problemas mdicos pasados. Antecedentes mdicos familiares. Salud actual, incluido  lo siguiente: Su bienestar emocional. Training and development officer y las relaciones personales. Su actividad sexual. Estilo de vida, incluido lo siguiente: Consumo de alcohol, nicotina, tabaco o drogas. Acceso a armas de fuego. Hbitos de alimentacin, ejercicio y sueo. Cuestiones de seguridad, como el uso de cinturn de seguridad y casco de Scientist, research (physical sciences). Uso de pantalla solar. Su trabajo y [place] laboral. Examen fsico El mdico revisar lo siguiente: Diplomatic Services operational officer y [place]. Estos pueden usarse para calcular el IMC (ndice de masa corporal). El [facility] es una medicin que indica si tiene un peso saludable. Circunferencia de la cintura. Es Neomia Dear medicin alrededor de Lobbyist. Esta medicin tambin indica si tiene un peso saludable y puede ayudar a predecir su riesgo de padecer ciertas enfermedades, como diabetes tipo 2 y presin arterial alta. Frecuencia cardaca y presin arterial. Temperatura corporal. Piel para detectar manchas anormales. Qu vacunas necesito?  Las vacunas se aplican a varias edades, segn un cronograma. El [employer] recomendar vacunas segn su edad, sus antecedentes mdicos, su estilo de vida y [address], como los viajes o el lugar donde trabaja. [place]  pruebas necesito? Pruebas de deteccin El mdico puede recomendar pruebas de deteccin de ciertas afecciones. Esto puede incluir: Niveles de lpidos y colesterol. Pruebas de deteccin de la diabetes. Esto se Physiological scientist un control del azcar en la sangre (glucosa) despus de no haber comido durante un periodo de tiempo (ayuno). Prueba de hepatitis B. Prueba de hepatitis C. Prueba del VIH (virus de inmunodeficiencia humana). Pruebas de infecciones de transmisin sexual (ITS), si est en riesgo. Pruebas de deteccin de cncer de pulmn. Examen de deteccin del cncer de prstata. Pruebas de deteccin de Building services engineer. Hable con su mdico sobre los [employer], las opciones de tratamiento y, si  corresponde, la necesidad de Education officer, environmental ms pruebas. Siga estas instrucciones en su casa: Comida y bebida  Siga una dieta que incluya frutas y verduras frescas, cereales integrales, protenas magras y productos lcteos descremados. Tome los suplementos vitamnicos y [employer] se lo haya indicado el mdico. No beba alcohol si el mdico se lo prohbe. Si bebe alcohol: Limite la cantidad que consume de 0 a 2 medidas por da. Sepa cunta cantidad de alcohol hay en las bebidas que toma. En los [address], una medida equivale a una botella de cerveza de 12 oz (355 ml), un vaso de vino de 5 oz (148 ml) o un vaso de una bebida alcohlica de alta graduacin de 1 oz (44 ml). Estilo de [employer] dientes a la maana y a la noche con Conservator, museum/gallery con fluoruro. Use hilo dental una vez al da. Haga al menos 30 minutos de ejercicio, 5 o ms [address]. No consuma ningn producto que contenga nicotina o tabaco. Estos productos incluyen cigarrillos, tabaco para Theatre manager y aparatos de vapeo, como los Administrator, Civil Service. Si necesita ayuda para dejar de fumar, consulte al mdico. No consuma drogas. Si es sexualmente activo, practique sexo seguro. Use un condn u otra forma de proteccin para prevenir las infecciones de transmisin sexual (ITS). Tome aspirina nicamente como se lo haya indicado el mdico. Asegrese de que comprende qu cantidad y cul presentacin debe tomar. Trabaje con el mdico para averiguar si es seguro y beneficioso para usted tomar aspirina a diario. Busque maneras saludables de Charity fundraiser, tales como: Meditacin, yoga o Optometrist. Lleve un diario personal. Hable con una persona confiable. Pase tiempo con amigos y familiares. Minimice la exposicin a la radiacin UV para reducir el riesgo de cncer de piel. Seguridad [place] siempre el cinturn de seguridad al conducir o viajar en un vehculo. No conduzca: Si ha estado bebiendo alcohol. No viaje con  un conductor que ha estado bebiendo. Si est cansado o distrado. Mientras est enviando mensajes de texto. Si ha estado usando sustancias o drogas que alteran la funcin mental. Use un casco y otros equipos de proteccin durante las actividades deportivas. Si tiene armas de fuego en su casa, asegrese de seguir todos los procedimientos de seguridad correspondientes. Cundo volver? Acuda al mdico una vez al ao para una visita anual de control de bienestar. Pregntele al mdico con qu frecuencia debe realizarse un control de la vista y los dientes. Mantenga su esquema de vacunacin al da. Esta informacin no tiene Theme park manager el consejo del mdico. Asegrese de hacerle al mdico cualquier pregunta que tenga. Document Revised: 03/25/2021 Document Reviewed: 03/25/2021 Elsevier Patient Education  2024 Elsevier Inc.   Insomnio Insomnia El insomnio es un trastorno del sueo que causa dificultades para conciliar el sueo o para [place]. Puede producir fatiga, falta de  energa, dificultad para concentrarse, cambios en el estado de nimo y mal rendimiento escolar o laboral. Hay tres formas diferentes de clasificar el insomnio: Dificultad para conciliar el sueo. Dificultad para mantener el sueo. Despertar muy precoz por la maana. Cualquier tipo de insomnio puede ser a Air cabin crew (crnico) o a Product manager (agudo). Ambos son frecuentes. Generalmente, el insomnio a corto plazo dura 3 meses o menos tiempo. El insomnio crnico ocurre al menos tres veces por semana durante ms de 3 meses. Cules son las causas? El insomnio puede deberse a otra afeccin, situacin o sustancia, por ejemplo: Tener ciertas afecciones de salud mental, como ansiedad y depresin. Consumir cafena, alcohol, tabaco o drogas. Tener afecciones gastrointestinales, como enfermedad de reflujo gastroesofgico (ERGE). Tener ciertas afecciones. Estas incluyen las siguientes: Asma. Enfermedad de  Alzheimer. Accidente cerebrovascular. Dolor crnico. Hiperactividad de la glndula tiroidea (hipertiroidismo). Otros trastornos del sueo, como sndrome de las piernas inquietas y apnea del sueo. Menopausia. En ocasiones, la causa del insomnio puede ser desconocida. Qu incrementa el riesgo? Los factores de riesgo de tener insomnio incluyen lo siguiente: Sexo. Esta afeccin es ms frecuente en las mujeres que en los hombres. Edad. El insomnio es ms frecuente a medida que las personas envejecen. El estrs y ciertas afecciones de salud mental y mdicas. Falta de actividad fsica. Tener un horario de trabajo irregular. Esto puede incluir trabajar turnos nocturnos y Tourist information centre manager entre diferentes zonas horarias. Cules son los signos o sntomas? Si tiene insomnio, el sntoma principal es la dificultad para conciliar el sueo o mantenerlo. Esto puede derivar en otros sntomas, por ejemplo: Sentirse cansado o tener poca energa. Ponerse nervioso por [employer] irse a dormir. No sentirse descansado por la maana. Tener dificultad para concentrarse. Sentirse irritable, ansioso o deprimido. Cmo se diagnostica? Esta afeccin se puede diagnosticar en funcin de lo siguiente: Los sntomas y antecedentes mdicos. El mdico puede hacerle preguntas sobre: Hbitos de sueo. Cualquier afeccin mdica que tenga. La salud mental. Un examen fsico. Cmo se trata? El tratamiento para el insomnio depende de la causa. El tratamiento puede centrarse en tratar una afeccin subyacente que causa el insomnio. El tratamiento tambin puede incluir lo siguiente: Medicamentos que lo ayuden a dormir. Asesoramiento psicolgico o terapia. Ajustes en el estilo de vida para ayudarlo a dormir mejor. Siga estas indicaciones en su casa: Comida y bebida  Limite o evite el consumo de alcohol, bebidas con cafena y productos que contengan nicotina y tabaco, especialmente cerca de la hora de [place]. Estos productos pueden  perturbarle el sueo. No consuma una comida suculenta ni coma alimentos condimentados justo antes de la hora de [place]. Esto puede causarle molestias digestivas y dificultades para dormir. Hbitos de sueo  Lleve un registro del sueo ya que podra ser de utilidad para que usted y a su mdico puedan determinar qu podra estar causndole insomnio. Escriba los siguientes datos: Cundo duerme. Cundo se despierta durante la noche. Qu tan bien duerme y si se siente descansado al da siguiente. Cualquier efecto secundario de los medicamentos que toma. Lo que usted come y bebe. Convierta su habitacin en un lugar oscuro, cmodo donde sea fcil conciliar el sueo. Coloque persianas o cortinas oscuras que impidan la entrada de la luz del exterior. Para bloquear los ruidos, use un aparato que reproduzca sonidos ambientales o relajantes de fondo. Mantenga baja la temperatura. Limite el uso de pantallas antes de la hora de [place]. Esto incluye: No ver televisin. No usar el telfono inteligente, la tableta o la computadora. Siga Neomia Dear  rutina que incluya ir a dormir y Chiropodist a la misma hora cada da y noche. Esto puede ayudarlo a conciliar el sueo ms rpidamente. Considere realizar [employer] tranquila, como leer, e incorporarla como parte de la rutina a la hora de irse a dormir. Trate de evitar tomar siestas durante el da para que pueda dormir mejor por la noche. Levntese de la cama si sigue despierto despus de 15 minutos de haber intentado dormirse. Mantenga bajas las luces, pero intente leer o hacer una actividad tranquila. Cuando tenga sueo, regrese a Pharmacist, hospital. Indicaciones generales Use los medicamentos de venta libre y los recetados solamente como se lo haya indicado el mdico. Haga actividad fsica habitualmente como se lo haya indicado el mdico. Sin embargo, evite hacer actividad fsica las horas antes de [place]. Utilice tcnicas de relajacin para controlar el estrs.  Pdale al mdico que le sugiera algunas tcnicas que sean adecuadas para usted. Pueden incluir: Ejercicios de respiracin. Rutinas para aliviar la tensin muscular. Visualizacin de escenas apacibles. Conduzca con cuidado. No conduzca si se siente muy somnoliento. Concurra a todas las visitas de seguimiento. Esto es importante. Comunquese con un mdico si: Est cansado durante todo Medical laboratory scientific officer. Tiene dificultad en su rutina diaria debido a la somnolencia. Sigue teniendo problemas para dormir o Teacher, English as a foreign language. Solicite ayuda de inmediato si: Piensa en lastimarse a usted mismo o a Engineer, maintenance (IT). Busque ayuda de inmediato si alguna vez siente que puede hacerse dao a usted mismo o a otros, o tiene pensamientos de Patent examiner a su vida. Dirjase al centro de urgencias ms cercano o: Llame al 911. Llame a National Suicide Prevention Lifeline (Lnea Telefnica Nacional para la Prevencin del Suicidio) al [phone] o al 988. Est disponible las 24 horas del da. Enve un mensaje de texto a la lnea para casos de crisis al [phone]. Resumen El insomnio es un trastorno del sueo que causa dificultades para conciliar el sueo o para [employer]. El insomnio puede ser a Air cabin crew (crnico) o a Product manager (agudo). El tratamiento para el insomnio depende de la causa. El tratamiento puede centrarse en tratar una afeccin subyacente que causa el insomnio. Lleve un registro del sueo ya que podra ser de utilidad para que usted y a su mdico puedan determinar qu podra estar causndole insomnio. Esta informacin no tiene Theme park manager el consejo del mdico. Asegrese de hacerle al mdico cualquier pregunta que tenga. Document Revised: 09/16/2021 Document Reviewed: 09/16/2021 Elsevier Patient Education  2024 Elsevier Inc.   Cmo sobrellevar la depresin en los adultos Managing Depression, Adult La depresin es una afeccin de salud mental que puede influir en los pensamientos, los sentimientos y [place]. Recibir el diagnstico de depresin puede traerle alivio si no saba por qu se senta o se comportaba de Chiropodist. Tambin podra hacer que se sienta abrumado. Encontrar formas de [employer] sntomas puede ayudarlo a sentirse ms positivo sobre el futuro. Cmo manejar los cambios en el estilo de vida Estar deprimido es difcil. La depresin puede aumentar el nivel de estrs diario. El estrs puede [employer] sntomas de la depresin. Es posible que crea que sus sntomas no se pueden controlar o que nunca mejorarn. Sin embargo, hay muchas cosas que puede intentar para ayudar a Chief Operating Officer sus sntomas. Hay esperanza. Control del estrs  El estrs es la reaccin del cuerpo ante los cambios y los acontecimientos de la vida, tanto [place]. El estrs puede aumentar los sentimientos de depresin. Aprender a controlar  el estrs puede ayudar a [employer] sentimientos de depresin. Pruebe algunos de los siguientes enfoques para reducir Development worker, community (tcnicas de reduccin del estrs): Escuche msica que le guste y que lo inspire. Intente usar una aplicacin de meditacin o tome una clase de meditacin. Desarrolle una prctica que le ayude a conectarse con su ser espiritual. Camine al [employer], rece o vaya a un lugar de culto. Practique respiracin profunda. Para hacerlo, inhale lentamente por la nariz. Haga una pausa cuando alcance el mximo de la inhalacin durante unos segundos y luego exhale lentamente, dejando que los msculos se relajen. Reptalo tres o cuatro veces. Practique yoga para relajarse y trabajar con los msculos. Elija una tcnica de reduccin del estrs que le funcione. Desarrollar estas tcnicas lleva tiempo y prctica. Resrvese de 5 a 15 minutos por da para Associate Professor. Algunos terapeutas pueden ofrecerle capacitacin para aprenderlas. Haga lo siguiente para ayudar a Sales executive estrs: Lleve un diario. Conozca sus lmites. Establezca lmites  saludables para usted y [place], como decir "no" cuando crea que algo es [place]. Preste atencin a cmo reacciona ante ciertas situaciones. Es posible que no pueda controlar todo, pero s puede controlar su reaccin. Aada humor a su vida viendo comedias o programas cmicos. Hgase tiempo para las actividades que disfruta y que la [place]. Dedique menos tiempo al uso de dispositivos electrnicos, en especial por la noche, antes de acostarse. La luz de las pantallas puede hacer que su cerebro piense que es hora de levantarse en lugar de [place].  Medicamentos Los medicamentos, como los antidepresivos, suelen ser parte del tratamiento para la depresin. Hable con su farmacutico o mdico sobre [employer], suplementos y productos a base de hierbas que toma, sus posibles efectos secundarios, y qu medicamentos y otros productos que se pueden tomar al mismo tiempo sin correr Herbalist. Es importante que informe a su mdico si tiene efectos secundarios. Las [employer] mdico puede sugerirle terapia familiar, terapia de pareja o terapia individual como parte del tratamiento. Cmo reconocer los [employer] persona tiene una respuesta distinta al tratamiento para la depresin. A medida que se recupere de la depresin, puede comenzar a hacer lo siguiente: Tener ms inters en hacer sus actividades. Sentirse ms esperanzado. Tener ms energa. Consumir una cantidad ms regular de alimentos. Tener mejor atencin mental. Es importante reconocer si la depresin no mejora o si empeora. Los sntomas que tuvo en el comienzo pueden volver, por ejemplo: Cansancio. Comer demasiado o muy poco. Dormir demasiado o muy poco. Sentirse agotado, agitado o desesperanzado. Dificultad para concentrarse o para tomar decisiones. Tener dolores y molestias inexplicables. Sentirse irritable, enojado o agresivo. Si usted o sus familiares advierten que estos sntomas regresan, infrmeselo al  mdico de inmediato. Siga estas indicaciones en su casa: Actividad Trate de Social research officer, government forma de ejercicio todos los [place], Corporate treasurer. Pruebe yoga, meditacin mindfulness u otras tcnicas de reduccin del estrs. Participe en actividades grupales si puede. Estilo de vida [place] lo suficiente. Reduzca o deje de consumir cafena, tabaco, bebidas alcohlicas y cualquier otra sustancia potencialmente peligrosa. Siga una dieta saludable que incluya abundantes frutas, verduras, cereales integrales, productos lcteos descremados y protenas magras. Limite el consumo de alimentos con alto contenido de grasas slidas, azcar agregado y sal (sodio). Indicaciones generales Use los medicamentos de venta libre y los recetados solamente como se lo haya indicado el mdico. Concurra a todas las visitas de seguimiento. Es importante que el mdico controle su estado de nimo, comportamiento y Media planner. El  mdico puede necesitar cambios en su tratamiento. Dnde obtener apoyo Hablar con [employer] amigos y familiares pueden ser fuentes de apoyo y [place]. Hable con amigos o familiares de confianza sobre su afeccin. Explqueles cules son sus sntomas e infrmeles que est trabajando con un mdico para tratar la depresin. Dgales a sus amigos y familiares cmo pueden ayudarlo. Finanzas Busque proveedores de [employer] se adapten a su situacin financiera. Hable con el mdico si le preocupa el acceso a alimentos, vivienda o medicamentos. Llame a su aseguradora para obtener informacin sobre sus copagos y su plan de medicamentos recetados. Dnde obtener ms informacin Puede encontrar apoyo en su zona en: Anxiety and Depression Association of [place] [ADAA] (Asociacin de Ansiedad y Depresin de los Estados Unidos): [email or web address] Mental Health [place] (Salud Mental de los [place]): [email or web address] The First American on Mental Illness (Alianza Nacional Sobre Enfermedades  Mentales): [email or web address] Comunquese con un mdico si: Deja de tomar los medicamentos antidepresivos y tiene alguno de estos sntomas: Nuseas. Dolor de [place]. Desvanecimiento. Escalofros y dolores corporales. Imposibilidad de dormir (insomnio). Usted o algn amigo o familiar advierten que su depresin est empeorando. Solicite ayuda de inmediato si: Piensa acerca de lastimarse o lastimar a Economist. Busque ayuda de inmediato si alguna vez siente que puede hacerse dao a usted mismo o a otros, o tiene pensamientos de Patent examiner a su vida. Dirjase al centro de urgencias ms cercano o: Llame al 911. Llame a National Suicide Prevention Lifeline (Lnea Telefnica Nacional para la Prevencin del Suicidio) al [phone] o al 988. Est disponible las 24 horas del da. Enve un mensaje de texto a la lnea para casos de crisis al [phone]. Esta informacin no tiene Theme park manager el consejo del mdico. Asegrese de hacerle al mdico cualquier pregunta que tenga. Document Revised: 02/01/2022 Document Reviewed: 02/01/2022 Elsevier Patient Education  2024 [employer].   Control de la ansiedad en los adultos Managing Anxiety, Adult Despus de haber recibido un diagnstico de ansiedad, es posible que se sienta aliviado al saber por qu se haba sentido o haba actuado de cierto modo. Es posible que tambin se sienta abrumado por el tratamiento que tiene por delante y por lo que este significar para su vida. Con cuidado y [address], puede controlar su ansiedad. Cmo manejar los cambios en el estilo de vida Comprender la diferencia entre estrs y ansiedad Aunque el estrs puede desempear un papel en la ansiedad, no es lo mismo que la ansiedad. El estrs es la reaccin del cuerpo ante los cambios y los acontecimientos de la vida, tanto buenos [place]. El estrs a menudo es causado por algo externo, como una fecha lmite, una prueba o una competencia. Normalmente desaparece despus de que el  acontecimiento ha finalizado y dura solo unas horas. Pero el estrs puede ser continuo y Science writer a ms que solo estrs. La ansiedad es causada por algo interno, como imaginar un resultado terrible o preocuparse porque algo ir mal y lo Copy. A menudo, la ansiedad no desaparece incluso despus de que el acontecimiento ha finalizado y puede convertirse en una preocupacin a largo plazo (crnica). Reducir el estrs y la ansiedad Hable con el mdico o un consejero para obtener ms informacin sobre cmo reducir la ansiedad y Development worker, community. Es posible que sugiera tcnicas para reducir la tensin, tales como: Msica. Pasar tiempo creando o escuchando msica que disfrute y lo inspire. Meditacin consciente. Practicar el estar consciente de la respiracin normal sin intentar controlarla. Puede realizarse  mientras est sentado o camina. Oracin centrante. Centrarse en [employer], frase o imagen sagrada que le signifique algo y le genere [address]. Respiracin profunda. Expanda el estmago e inhale lentamente por la nariz. Mantenga el aire durante unos 3 a 5 segundos. Luego, exhale lentamente mientras deja que los msculos del estmago se relajen. Dilogo interno. Aprenda a observar y Public house manager patrones de pensamiento que provocan reacciones de ansiedad. Cambie esos patrones por pensamientos que transmitan tranquilidad. Relajacin muscular. Dedique tiempo a tensar los msculos y [employer]. Elija una tcnica para reducir la tensin que se adapte a su estilo de vida y su personalidad. Estas tcnicas llevan tiempo y prctica. Resrvese de 5 a 15 minutos por da para Associate Professor. Los terapeutas especializados pueden ofrecer orientacin y capacitacin en estas tcnicas. Es posible que algunos planes de seguros mdicos cubran la capacitacin. Otras cosas que puede hacer para controlar el estrs y la ansiedad incluyen: Midwife un registro del estrs. Esto puede ayudarlo a identificar qu le desencadena su reaccin  y, Engineer, mining, aprender las maneras de controlar su respuesta al [place]. Pensar en cmo reacciona ante ciertas situaciones. Es posible que no sea capaz de Chief Operating Officer todo, pero puede Corporate investment banker. Hacerse tiempo para las actividades que lo ayudan a Lexicographer y no sentir culpa por pasar su tiempo de [place]. Crear imgenes visuales. Esto implica imaginar o crear imgenes mentales para ayudarlo a relajarse. Practicar yoga. A travs de las posturas de yoga, puede disminuir la tensin y [place].  Medicamentos Algunos medicamentos para la ansiedad: Antidepresivos. Por lo general, se recetan para el control diario a Air cabin crew. Medicamentos para la ansiedad. Estos se pueden agregar en casos graves, especialmente cuando ocurren crisis de [place]. Cuando se usan juntos, los medicamentos, la psicoterapia y las tcnicas de reduccin de la tensin pueden ser el tratamiento ms efectivo. Las [employer] relaciones interpersonales pueden ser muy importantes para ayudar a su recuperacin. Pase ms tiempo interactuando con amigos y familiares de [place]. Piense en ir a terapia de pareja, si tiene [employer], tomar clases de educacin familiar o ir a Information systems manager. La terapia puede ayudarlos a usted y a los dems a comprender mejor su ansiedad. Cmo Facilities manager en su ansiedad Cada persona responde de [place] diferente al tratamiento de la ansiedad. Se dice que se est recuperado de la ansiedad cuando los sntomas se reducen y dejan de interferir en la vida diaria en el hogar o el trabajo. Esto podra significar que comenzar a Radio producer lo siguiente: Dealer y atencin. Tener menos interferencia de la preocupacin en el pensamiento diario. Dormir mejor. Estar menos irritable. Tener ms energa. Tener [employer]. Trate de reconocer cundo su afeccin empeora. Comunquese con el mdico si sus sntomas interfieren en su hogar o su trabajo, y usted siente que su afeccin no  est mejorando. Siga estas instrucciones en su casa: Actividad Actividad fsica. Los adultos deben hacer lo siguiente: Education officer, environmental, al menos, 150 minutos de actividad fsica por semana. El ejercicio debe aumentar la frecuencia cardaca y Media planner transpirar (ejercicio de intensidad moderada). Hacer ejercicios de fortalecimiento por lo [employer] por semana. Duerma bien y por el tiempo [place]. La [employer] de los adultos necesitan entre 7 y 9 horas de sueo todas las noches. Estilo de vida  Siga una dieta saludable que incluya abundantes frutas, verduras, cereales integrales, productos lcteos descremados y protenas magras. No consuma muchos alimentos con alto contenido de grasas, azcares agregados o sal (sodio). Opte por cosas que le simplifiquen  la vida. No consuma ningn producto que contenga nicotina o tabaco. Estos productos incluyen cigarrillos, tabaco para Theatre manager y aparatos de vapeo, como los Administrator, Civil Service. Si necesita ayuda para dejar de fumar, consulte al mdico. Evite el consumo de cafena, alcohol y ciertos medicamentos contra el resfro de venta sin receta. Estos podran Optician, dispensing. Pregntele al farmacutico qu medicamentos no debera tomar. Instrucciones generales Use los medicamentos de venta libre y los recetados solamente como se lo haya indicado el mdico. Concurra a todas las visitas de seguimiento. Esto es para verificar que est controlando su ansiedad o si necesita ms apoyo. Dnde obtener apoyo Puede conseguir [place] y [place] de: Grupos de [place]. Organizaciones comunitarias y en lnea. Un lder espiritual de confianza. Terapia de pareja. Clases de educacin familiar. Terapia familiar. Dnde obtener ms informacin Formar parte de un grupo de apoyo podra resultarle til para enfrentar la ansiedad. Las siguientes fuentes pueden ayudarlo a Clinical research associate consejeros o grupos de apoyo cerca de su hogar: Mental Health America (Salud Mental de los  Estados Unidos): [email or web address] Anxiety and Depression Association of [place] [ADAA] (Asociacin de Ansiedad y Depresin de los Estados Unidos): adaa.[email or web address] The First American on Mental Illness (NAMI) (Alianza Nacional Sobre Enfermedades Mentales): [email or web address] Comunquese con un mdico si: Le resulta difcil permanecer concentrado o finalizar las tareas. Pasa muchas horas por da sintindose preocupado por la vida cotidiana. Est muy cansado porque no puede dejar de preocuparse. Comienza a tener dolores de [place] o se siente tenso con frecuencia. Tiene nuseas o diarrea crnicas. Solicite ayuda de inmediato si: Siente que tiene el corazn acelerado. Le falta el aire. Piensa acerca de lastimarse o lastimar a Economist. Busque ayuda de inmediato si alguna vez siente que puede hacerse dao a usted mismo o a otros, o tiene pensamientos de Patent examiner a su vida. Dirjase al centro de urgencias ms cercano o: Llame al 911. Llame a National Suicide Prevention Lifeline (Lnea Telefnica Nacional para la Prevencin del Suicidio) al [phone] o al 988. Est disponible las 24 horas del da. Enve un mensaje de texto a la lnea para casos de crisis al [phone]. Esta informacin no tiene Theme park manager el consejo del mdico. Asegrese de hacerle al mdico cualquier pregunta que tenga. Document Revised: 07/13/2022 Document Reviewed: 01/14/2021 Elsevier Patient Education  2024 Elsevier Inc.     Signed,   Meredith Staggers, MD [facility], [facility] 08/09/23 3:39 PM

## 2023-08-09 NOTE — Patient Instructions (Signed)
Start Zoloft once per day.  That can help depression and anxiety, and may also help with sleep.  Okay to continue same sleep medication hydroxyzine up to 2 pills, but you can always try taking just a half a pill to see if that is effective to help you get to sleep.  Use the lowest effective dose.  See information below on managing anxiety and depression, as well as insomnia.  I will check some labs today, but follow-up with me in 6 weeks and we can review those labs as well as anxiety, depression and sleep.  Take care!  Cuidados preventivos en los hombres de 40 a 64 aos de edad Preventive Care 28-35 Years Old, Male Los cuidados preventivos hacen referencia a las opciones en cuanto al estilo de vida y a las visitas al mdico, las cuales pueden promover la salud y Counsellor. Las visitas de cuidado preventivo tambin se denominan exmenes de Health visitor. Qu puedo esperar para mi visita de cuidado preventivo? Asesoramiento Durante la visita de cuidado preventivo, el mdico puede preguntarle sobre lo siguiente: Antecedentes mdicos, incluidos los siguientes: Problemas mdicos pasados. Antecedentes mdicos familiares. Salud actual, incluido lo siguiente: Su bienestar emocional. Training and development officer y las relaciones personales. Su actividad sexual. Estilo de vida, incluido lo siguiente: Consumo de alcohol, nicotina, tabaco o drogas. Acceso a armas de fuego. Hbitos de alimentacin, ejercicio y sueo. Cuestiones de seguridad, como el uso de cinturn de seguridad y casco de Scientist, research (physical sciences). Uso de pantalla solar. Su trabajo y [place] laboral. Examen fsico El mdico revisar lo siguiente: Diplomatic Services operational officer y [place]. Estos pueden usarse para calcular el IMC (ndice de masa corporal). El [facility] es una medicin que indica si tiene un peso saludable. Circunferencia de la cintura. Es Neomia Dear medicin alrededor de Lobbyist. Esta medicin tambin indica si tiene un peso saludable y puede ayudar a predecir su riesgo de  padecer ciertas enfermedades, como diabetes tipo 2 y presin arterial alta. Frecuencia cardaca y presin arterial. Temperatura corporal. Piel para detectar manchas anormales. Qu vacunas necesito?  Las vacunas se aplican a varias edades, segn un cronograma. El [employer] recomendar vacunas segn su edad, sus antecedentes mdicos, su estilo de vida y [address], como los viajes o el lugar donde trabaja. Qu pruebas necesito? Pruebas de deteccin El mdico puede recomendar pruebas de deteccin de ciertas afecciones. Esto puede incluir: Niveles de lpidos y colesterol. Pruebas de deteccin de la diabetes. Esto se Physiological scientist un control del azcar en la sangre (glucosa) despus de no haber comido durante un periodo de tiempo (ayuno). Prueba de hepatitis B. Prueba de hepatitis C. Prueba del VIH (virus de inmunodeficiencia humana). Pruebas de infecciones de transmisin sexual (ITS), si est en riesgo. Pruebas de deteccin de cncer de pulmn. Examen de deteccin del cncer de prstata. Pruebas de deteccin de Building services engineer. Hable con su mdico sobre los [employer], las opciones de tratamiento y, si corresponde, la necesidad de Education officer, environmental ms pruebas. Siga estas instrucciones en su casa: Comida y bebida  Siga una dieta que incluya frutas y verduras frescas, cereales integrales, protenas magras y productos lcteos descremados. Tome los suplementos vitamnicos y [employer] se lo haya indicado el mdico. No beba alcohol si el mdico se lo prohbe. Si bebe alcohol: Limite la cantidad que consume de 0 a 2 medidas por da. Sepa cunta cantidad de alcohol hay en las bebidas que toma. En los [place], una medida equivale a una botella de cerveza de 12 oz (355 ml),  un vaso de vino de 5 oz (148 ml) o un vaso de una bebida alcohlica de alta graduacin de 1 oz (44 ml). Estilo de [employer] dientes a la maana y a la noche con Conservator, museum/gallery con fluoruro.  Use hilo dental una vez al da. Haga al menos 30 minutos de ejercicio, 5 o ms [address]. No consuma ningn producto que contenga nicotina o tabaco. Estos productos incluyen cigarrillos, tabaco para Theatre manager y aparatos de vapeo, como los Administrator, Civil Service. Si necesita ayuda para dejar de fumar, consulte al mdico. No consuma drogas. Si es sexualmente activo, practique sexo seguro. Use un condn u otra forma de proteccin para prevenir las infecciones de transmisin sexual (ITS). Tome aspirina nicamente como se lo haya indicado el mdico. Asegrese de que comprende qu cantidad y cul presentacin debe tomar. Trabaje con el mdico para averiguar si es seguro y beneficioso para usted tomar aspirina a diario. Busque maneras saludables de Charity fundraiser, tales como: Meditacin, yoga o Optometrist. Lleve un diario personal. Hable con una persona confiable. Pase tiempo con amigos y familiares. Minimice la exposicin a la radiacin UV para reducir el riesgo de cncer de piel. Seguridad [place] siempre el cinturn de seguridad al conducir o viajar en un vehculo. No conduzca: Si ha estado bebiendo alcohol. No viaje con un conductor que ha estado bebiendo. Si est cansado o distrado. Mientras est enviando mensajes de texto. Si ha estado usando sustancias o drogas que alteran la funcin mental. Use un casco y otros equipos de proteccin durante las actividades deportivas. Si tiene armas de fuego en su casa, asegrese de seguir todos los procedimientos de seguridad correspondientes. Cundo volver? Acuda al mdico una vez al ao para una visita anual de control de bienestar. Pregntele al mdico con qu frecuencia debe realizarse un control de la vista y los dientes. Mantenga su esquema de vacunacin al da. Esta informacin no tiene Theme park manager el consejo del mdico. Asegrese de hacerle al mdico cualquier pregunta que tenga. Document Revised: 03/25/2021 Document  Reviewed: 03/25/2021 Elsevier Patient Education  2024 Elsevier Inc.   Insomnio Insomnia El insomnio es un trastorno del sueo que causa dificultades para conciliar el sueo o para [place]. Puede producir fatiga, falta de energa, dificultad para concentrarse, cambios en el estado de nimo y mal rendimiento escolar o laboral. Hay tres formas diferentes de clasificar el insomnio: Dificultad para conciliar el sueo. Dificultad para mantener el sueo. Despertar muy precoz por la maana. Cualquier tipo de insomnio puede ser a Air cabin crew (crnico) o a Product manager (agudo). Ambos son frecuentes. Generalmente, el insomnio a corto plazo dura 3 meses o menos tiempo. El insomnio crnico ocurre al menos tres veces por semana durante ms de 3 meses. Cules son las causas? El insomnio puede deberse a otra afeccin, situacin o sustancia, por ejemplo: Tener ciertas afecciones de salud mental, como ansiedad y depresin. Consumir cafena, alcohol, tabaco o drogas. Tener afecciones gastrointestinales, como enfermedad de reflujo gastroesofgico (ERGE). Tener ciertas afecciones. Estas incluyen las siguientes: Asma. Enfermedad de Alzheimer. Accidente cerebrovascular. Dolor crnico. Hiperactividad de la glndula tiroidea (hipertiroidismo). Otros trastornos del sueo, como sndrome de las piernas inquietas y apnea del sueo. Menopausia. En ocasiones, la causa del insomnio puede ser desconocida. Qu incrementa el riesgo? Los factores de riesgo de tener insomnio incluyen lo siguiente: Sexo. Esta afeccin es ms frecuente en las mujeres que en los hombres. Edad. El insomnio es ms frecuente a medida que las personas envejecen. El estrs y  ciertas afecciones de salud mental y mdicas. Falta de actividad fsica. Tener un horario de trabajo irregular. Esto puede incluir trabajar turnos nocturnos y Tourist information centre manager entre diferentes zonas horarias. Cules son los signos o sntomas? Si tiene insomnio, el sntoma  principal es la dificultad para conciliar el sueo o mantenerlo. Esto puede derivar en otros sntomas, por ejemplo: Sentirse cansado o tener poca energa. Ponerse nervioso por [employer] irse a dormir. No sentirse descansado por la maana. Tener dificultad para concentrarse. Sentirse irritable, ansioso o deprimido. Cmo se diagnostica? Esta afeccin se puede diagnosticar en funcin de lo siguiente: Los sntomas y antecedentes mdicos. El mdico puede hacerle preguntas sobre: Hbitos de sueo. Cualquier afeccin mdica que tenga. La salud mental. Un examen fsico. Cmo se trata? El tratamiento para el insomnio depende de la causa. El tratamiento puede centrarse en tratar una afeccin subyacente que causa el insomnio. El tratamiento tambin puede incluir lo siguiente: Medicamentos que lo ayuden a dormir. Asesoramiento psicolgico o terapia. Ajustes en el estilo de vida para ayudarlo a dormir mejor. Siga estas indicaciones en su casa: Comida y bebida  Limite o evite el consumo de alcohol, bebidas con cafena y productos que contengan nicotina y tabaco, especialmente cerca de la hora de [place]. Estos productos pueden perturbarle el sueo. No consuma una comida suculenta ni coma alimentos condimentados justo antes de la hora de [place]. Esto puede causarle molestias digestivas y dificultades para dormir. Hbitos de sueo  Lleve un registro del sueo ya que podra ser de utilidad para que usted y a su mdico puedan determinar qu podra estar causndole insomnio. Escriba los siguientes datos: Cundo duerme. Cundo se despierta durante la noche. Qu tan bien duerme y si se siente descansado al da siguiente. Cualquier efecto secundario de los medicamentos que toma. Lo que usted come y bebe. Convierta su habitacin en un lugar oscuro, cmodo donde sea fcil conciliar el sueo. Coloque persianas o cortinas oscuras que impidan la entrada de la luz del exterior. Para bloquear los ruidos,  use un aparato que reproduzca sonidos ambientales o relajantes de fondo. Mantenga baja la temperatura. Limite el uso de pantallas antes de la hora de [place]. Esto incluye: No ver televisin. No usar el telfono inteligente, la tableta o la computadora. Siga una rutina que incluya ir a dormir y Chiropodist a la misma hora cada da y noche. Esto puede ayudarlo a conciliar el sueo ms rpidamente. Considere realizar [employer] tranquila, como leer, e incorporarla como parte de la rutina a la hora de irse a dormir. Trate de evitar tomar siestas durante el da para que pueda dormir mejor por la noche. Levntese de la cama si sigue despierto despus de 15 minutos de haber intentado dormirse. Mantenga bajas las luces, pero intente leer o hacer una actividad tranquila. Cuando tenga sueo, regrese a Pharmacist, hospital. Indicaciones generales Use los medicamentos de venta libre y los recetados solamente como se lo haya indicado el mdico. Haga actividad fsica habitualmente como se lo haya indicado el mdico. Sin embargo, evite hacer actividad fsica las horas antes de [place]. Utilice tcnicas de relajacin para controlar el estrs. Pdale al mdico que le sugiera algunas tcnicas que sean adecuadas para usted. Pueden incluir: Ejercicios de respiracin. Rutinas para aliviar la tensin muscular. Visualizacin de escenas apacibles. Conduzca con cuidado. No conduzca si se siente muy somnoliento. Concurra a todas las visitas de seguimiento. Esto es importante. Comunquese con un mdico si: Est cansado durante todo Medical laboratory scientific officer. Tiene dificultad en su rutina diaria debido a la  somnolencia. Sigue teniendo problemas para dormir o Teacher, English as a foreign language. Solicite ayuda de inmediato si: Piensa en lastimarse a usted mismo o a Engineer, maintenance (IT). Busque ayuda de inmediato si alguna vez siente que puede hacerse dao a usted mismo o a otros, o tiene pensamientos de Patent examiner a su vida. Dirjase al centro de urgencias ms cercano  o: Llame al 911. Llame a National Suicide Prevention Lifeline (Lnea Telefnica Nacional para la Prevencin del Suicidio) al [phone] o al 988. Est disponible las 24 horas del da. Enve un mensaje de texto a la lnea para casos de crisis al [phone]. Resumen El insomnio es un trastorno del sueo que causa dificultades para conciliar el sueo o para [place]. El insomnio puede ser a Air cabin crew (crnico) o a Product manager (agudo). El tratamiento para el insomnio depende de la causa. El tratamiento puede centrarse en tratar una afeccin subyacente que causa el insomnio. Lleve un registro del sueo ya que podra ser de utilidad para que usted y a su mdico puedan determinar qu podra estar causndole insomnio. Esta informacin no tiene Theme park manager el consejo del mdico. Asegrese de hacerle al mdico cualquier pregunta que tenga. Document Revised: 09/16/2021 Document Reviewed: 09/16/2021 Elsevier Patient Education  2024 Elsevier Inc.   Cmo sobrellevar la depresin en los adultos Managing Depression, Adult La depresin es una afeccin de salud mental que puede influir en los pensamientos, los sentimientos y [place]. Recibir el diagnstico de depresin puede traerle alivio si no saba por qu se senta o se comportaba de Chiropodist. Tambin podra hacer que se sienta abrumado. Encontrar formas de [employer] sntomas puede ayudarlo a sentirse ms positivo sobre el futuro. Cmo manejar los cambios en el estilo de vida Estar deprimido es difcil. La depresin puede aumentar el nivel de estrs diario. El estrs puede [employer] sntomas de la depresin. Es posible que crea que sus sntomas no se pueden controlar o que nunca mejorarn. Sin embargo, hay muchas cosas que puede intentar para ayudar a Chief Operating Officer sus sntomas. Hay esperanza. Control del estrs  El estrs es la reaccin del cuerpo ante los cambios y los acontecimientos de la vida, tanto buenos [place]. El estrs puede aumentar los sentimientos de depresin. Aprender a controlar el estrs puede ayudar a disminuir los sentimientos de depresin. Pruebe algunos de los siguientes enfoques para reducir Development worker, community (tcnicas de reduccin del estrs): Escuche msica que le guste y que lo inspire. Intente usar una aplicacin de meditacin o tome una clase de meditacin. Desarrolle una prctica que le ayude a conectarse con su ser espiritual. [place]ine al [employer], rece o vaya a un lugar de culto. Practique respiracin profunda. Para hacerlo, inhale lentamente por la nariz. Haga una pausa cuando alcance el mximo de la inhalacin durante unos segundos y luego exhale lentamente, dejando que los msculos se relajen. Reptalo tres o cuatro veces. Practique yoga para relajarse y trabajar con los msculos. Elija una tcnica de reduccin del estrs que le funcione. Desarrollar estas tcnicas lleva tiempo y prctica. Resrvese de 5 a 15 minutos por da para Associate Professor. Algunos terapeutas pueden ofrecerle capacitacin para aprenderlas. Haga lo siguiente para ayudar a Sales executive estrs: Lleve un diario. Conozca sus lmites. Establezca lmites saludables para usted y [place], como decir "no" cuando crea que algo es [place]. Preste atencin a cmo reacciona ante ciertas situaciones. Es posible que no pueda controlar todo, pero s puede controlar su reaccin. Aada humor a su vida viendo comedias o  programas cmicos. Hgase tiempo para las actividades que disfruta y que la [place]. Dedique menos tiempo al uso de dispositivos electrnicos, en especial por la noche, antes de acostarse. La luz de las pantallas puede hacer que su cerebro piense que es hora de levantarse en lugar de [place].  Medicamentos Los medicamentos, como los antidepresivos, suelen ser parte del tratamiento para la depresin. Hable con su farmacutico o mdico sobre [employer], suplementos y productos a base de hierbas  que toma, sus posibles efectos secundarios, y qu medicamentos y otros productos que se pueden tomar al mismo tiempo sin correr Herbalist. Es importante que informe a su mdico si tiene efectos secundarios. Las [employer] mdico puede sugerirle terapia familiar, terapia de pareja o terapia individual como parte del tratamiento. Cmo reconocer los [employer] persona tiene una respuesta distinta al tratamiento para la depresin. A medida que se recupere de la depresin, puede comenzar a hacer lo siguiente: Tener ms inters en hacer sus actividades. Sentirse ms esperanzado. Tener ms energa. Consumir una cantidad ms regular de alimentos. Tener mejor atencin mental. Es importante reconocer si la depresin no mejora o si empeora. Los sntomas que tuvo en el comienzo pueden volver, por ejemplo: Cansancio. Comer demasiado o muy poco. Dormir demasiado o muy poco. Sentirse agotado, agitado o desesperanzado. Dificultad para concentrarse o para tomar decisiones. Tener dolores y molestias inexplicables. Sentirse irritable, enojado o agresivo. Si usted o sus familiares advierten que estos sntomas regresan, infrmeselo al mdico de inmediato. Siga estas indicaciones en su casa: Actividad Trate de Social research officer, government forma de ejercicio todos los [place], Corporate treasurer. Pruebe yoga, meditacin mindfulness u otras tcnicas de reduccin del estrs. Participe en actividades grupales si puede. Estilo de vida [place] lo suficiente. Reduzca o deje de consumir cafena, tabaco, bebidas alcohlicas y cualquier otra sustancia potencialmente peligrosa. Siga una dieta saludable que incluya abundantes frutas, verduras, cereales integrales, productos lcteos descremados y protenas magras. Limite el consumo de alimentos con alto contenido de grasas slidas, azcar agregado y sal (sodio). Indicaciones generales Use los medicamentos de venta libre y los recetados solamente como se lo haya indicado el mdico. Concurra a  todas las visitas de seguimiento. Es importante que el mdico controle su estado de nimo, comportamiento y Media planner. El mdico puede necesitar cambios en su tratamiento. Dnde obtener apoyo Hablar con [employer] amigos y familiares pueden ser fuentes de apoyo y [place]. Hable con amigos o familiares de confianza sobre su afeccin. Explqueles cules son sus sntomas e infrmeles que est trabajando con un mdico para tratar la depresin. Dgales a sus amigos y familiares cmo pueden ayudarlo. Finanzas Busque proveedores de [employer] se adapten a su situacin financiera. Hable con el mdico si le preocupa el acceso a alimentos, vivienda o medicamentos. Llame a su aseguradora para obtener informacin sobre sus copagos y su plan de medicamentos recetados. Dnde obtener ms informacin Puede encontrar apoyo en su zona en: Anxiety and Depression Association of [place] [ADAA] (Asociacin de Ansiedad y Depresin de los Estados Unidos): adaa.org Mental Health [place] (Salud Mental de los [place]): mentalhealthamerica.net The First American on Mental Illness (Alianza Nacional Sobre Enfermedades Mentales): nami.org Comunquese con un mdico si: Deja de tomar los medicamentos antidepresivos y tiene alguno de estos sntomas: Nuseas. Dolor de [place]. Desvanecimiento. Escalofros y dolores corporales. Imposibilidad de dormir (insomnio). Usted o algn amigo o familiar advierten que su depresin est empeorando. Solicite ayuda de inmediato si: Piensa acerca de lastimarse o lastimar a Economist. Busque ayuda de  inmediato si alguna vez siente que puede hacerse dao a usted mismo o a otros, o tiene pensamientos de Patent examiner a su vida. Dirjase al centro de urgencias ms cercano o: Llame al 911. Llame a National Suicide Prevention Lifeline (Lnea Telefnica Nacional para la Prevencin del Suicidio) al [phone] o al 988. Est disponible las 24 horas del da. Enve un  mensaje de texto a la lnea para casos de crisis al [phone]. Esta informacin no tiene Theme park manager el consejo del mdico. Asegrese de hacerle al mdico cualquier pregunta que tenga. Document Revised: 02/01/2022 Document Reviewed: 02/01/2022 Elsevier Patient Education  2024 [employer].   Control de la ansiedad en los adultos Managing Anxiety, Adult Despus de haber recibido un diagnstico de ansiedad, es posible que se sienta aliviado al saber por qu se haba sentido o haba actuado de cierto modo. Es posible que tambin se sienta abrumado por el tratamiento que tiene por delante y por lo que este significar para su vida. Con cuidado y [address], puede controlar su ansiedad. Cmo manejar los cambios en el estilo de vida Comprender la diferencia entre estrs y ansiedad Aunque el estrs puede desempear un papel en la ansiedad, no es lo mismo que la ansiedad. El estrs es la reaccin del cuerpo ante los cambios y los acontecimientos de la vida, tanto buenos [place]. El estrs a menudo es causado por algo externo, como una fecha lmite, una prueba o una competencia. Normalmente desaparece despus de que el acontecimiento ha finalizado y dura solo unas horas. Pero el estrs puede ser continuo y Science writer a ms que solo estrs. La ansiedad es causada por algo interno, como imaginar un resultado terrible o preocuparse porque algo ir mal y lo Copy. A menudo, la ansiedad no desaparece incluso despus de que el acontecimiento ha finalizado y puede convertirse en una preocupacin a largo plazo (crnica). Reducir el estrs y la ansiedad Hable con el mdico o un consejero para obtener ms informacin sobre cmo reducir la ansiedad y Development worker, community. Es posible que sugiera tcnicas para reducir la tensin, tales como: Msica. Pasar tiempo creando o escuchando msica que disfrute y lo inspire. Meditacin consciente. Practicar el estar consciente de la respiracin normal sin intentar controlarla.  Puede realizarse mientras est sentado o camina. Oracin centrante. Centrarse en [employer], frase o imagen sagrada que le signifique algo y le genere [address]. Respiracin profunda. Expanda el estmago e inhale lentamente por la nariz. Mantenga el aire durante unos 3 a 5 segundos. Luego, exhale lentamente mientras deja que los msculos del estmago se relajen. Dilogo interno. Aprenda a observar y Public house manager patrones de pensamiento que provocan reacciones de ansiedad. Cambie esos patrones por pensamientos que transmitan tranquilidad. Relajacin muscular. Dedique tiempo a tensar los msculos y [employer]. Elija una tcnica para reducir la tensin que se adapte a su estilo de vida y su personalidad. Estas tcnicas llevan tiempo y prctica. Resrvese de 5 a 15 minutos por da para Associate Professor. Los terapeutas especializados pueden ofrecer orientacin y capacitacin en estas tcnicas. Es posible que algunos planes de seguros mdicos cubran la capacitacin. Otras cosas que puede hacer para controlar el estrs y la ansiedad incluyen: Midwife un registro del estrs. Esto puede ayudarlo a identificar qu le desencadena su reaccin y, Engineer, mining, aprender las maneras de controlar su respuesta al [place]. Pensar en cmo reacciona ante ciertas situaciones. Es posible que no sea capaz de Chief Operating Officer todo, pero puede Corporate investment banker. Hacerse tiempo para las actividades que lo [place]  a relajarse y no sentir culpa por pasar su tiempo de [place]. Crear imgenes visuales. Esto implica imaginar o crear imgenes mentales para ayudarlo a relajarse. Practicar yoga. A travs de las posturas de yoga, puede disminuir la tensin y [place].  Medicamentos Algunos medicamentos para la ansiedad: Antidepresivos. Por lo general, se recetan para el control diario a Air cabin crew. Medicamentos para la ansiedad. Estos se pueden agregar en casos graves, especialmente cuando ocurren crisis de [place]. Cuando se usan juntos, los  medicamentos, la psicoterapia y las tcnicas de reduccin de la tensin pueden ser el tratamiento ms efectivo. Las [employer] relaciones interpersonales pueden ser muy importantes para ayudar a su recuperacin. Pase ms tiempo interactuando con amigos y familiares de [place]. Piense en ir a terapia de pareja, si tiene [employer], tomar clases de educacin familiar o ir a Information systems manager. La terapia puede ayudarlos a usted y a los dems a comprender mejor su ansiedad. Cmo Facilities manager en su ansiedad Cada persona responde de [place] diferente al tratamiento de la ansiedad. Se dice que se est recuperado de la ansiedad cuando los sntomas se reducen y dejan de interferir en la vida diaria en el hogar o el trabajo. Esto podra significar que comenzar a Radio producer lo siguiente: Dealer y atencin. Tener menos interferencia de la preocupacin en el pensamiento diario. Dormir mejor. Estar menos irritable. Tener ms energa. Tener [employer]. Trate de reconocer cundo su afeccin empeora. Comunquese con el mdico si sus sntomas interfieren en su hogar o su trabajo, y usted siente que su afeccin no est mejorando. Siga estas instrucciones en su casa: Actividad Actividad fsica. Los adultos deben hacer lo siguiente: Education officer, environmental, al menos, 150 minutos de actividad fsica por semana. El ejercicio debe aumentar la frecuencia cardaca y Media planner transpirar (ejercicio de intensidad moderada). Hacer ejercicios de fortalecimiento por lo [employer] por semana. Duerma bien y por el tiempo [place]. [place] [employer] de los adultos necesitan entre 7 y 9 horas de sueo todas las noches. Estilo de vida  Siga una dieta saludable que incluya abundantes frutas, verduras, cereales integrales, productos lcteos descremados y protenas magras. No consuma muchos alimentos con alto contenido de grasas, azcares agregados o sal (sodio). Opte por cosas que le simplifiquen la vida. No consuma ningn  producto que contenga nicotina o tabaco. Estos productos incluyen cigarrillos, tabaco para Theatre manager y aparatos de vapeo, como los Administrator, Civil Service. Si necesita ayuda para dejar de fumar, consulte al mdico. Evite el consumo de cafena, alcohol y ciertos medicamentos contra el resfro de venta sin receta. Estos podran Optician, dispensing. Pregntele al farmacutico qu medicamentos no debera tomar. Instrucciones generales Use los medicamentos de venta libre y los recetados solamente como se lo haya indicado el mdico. Concurra a todas las visitas de seguimiento. Esto es para verificar que est controlando su ansiedad o si necesita ms apoyo. Dnde obtener apoyo Puede conseguir [place] y [place] de: Grupos de [place]. Organizaciones comunitarias y en lnea. Un lder espiritual de confianza. Terapia de pareja. Clases de educacin familiar. Terapia familiar. Dnde obtener ms informacin Formar parte de un grupo de apoyo podra resultarle til para enfrentar la ansiedad. Las siguientes fuentes pueden ayudarlo a Clinical research associate consejeros o grupos de apoyo cerca de su hogar: Mental Health America (Salud Mental de los [place]idos): [place].net Anxiety and Depression Association of [place] [ADAA] (Asociacin de Ansiedad y Depresin de los Estados Unidos): adaa.org The First American on Mental Illness (NAMI) (Alianza Nacional Sobre Enfermedades Mentales): nami.org Comunquese con un mdico  si: Le resulta difcil permanecer concentrado o finalizar las tareas. Pasa muchas horas por da sintindose preocupado por la vida cotidiana. Est muy cansado porque no puede dejar de preocuparse. Comienza a tener dolores de [place] o se siente tenso con frecuencia. Tiene nuseas o diarrea crnicas. Solicite ayuda de inmediato si: Siente que tiene el corazn acelerado. Le falta el aire. Piensa acerca de lastimarse o lastimar a Economist. Busque ayuda de inmediato si alguna vez siente que puede  hacerse dao a usted mismo o a otros, o tiene pensamientos de Patent examiner a su vida. Dirjase al centro de urgencias ms cercano o: Llame al 911. Llame a National Suicide Prevention Lifeline (Lnea Telefnica Nacional para la Prevencin del Suicidio) al 574-681-6519 o al 988. Est disponible las 24 horas del da. Enve un mensaje de texto a la lnea para casos de crisis al [phone]. Esta informacin no tiene Theme park manager el consejo del mdico. Asegrese de hacerle al mdico cualquier pregunta que tenga. Document Revised: 07/13/2022 Document Reviewed: 01/14/2021 Elsevier Patient Education  2024 [employer].

## 2023-08-10 LAB — COMPREHENSIVE METABOLIC PANEL
ALT: 24 U/L (ref 0–53)
AST: 25 U/L (ref 0–37)
Albumin: 4.5 g/dL (ref 3.5–5.2)
Alkaline Phosphatase: 56 U/L (ref 39–117)
BUN: 18 mg/dL (ref 6–23)
CO2: 30 meq/L (ref 19–32)
Calcium: 9.3 mg/dL (ref 8.4–10.5)
Chloride: 101 meq/L (ref 96–112)
Creatinine, Ser: 0.89 mg/dL (ref 0.40–1.50)
GFR: 98.02 mL/min (ref >60.00)
Glucose, Bld: 81 mg/dL (ref 70–99)
Potassium: 4.2 meq/L (ref 3.5–5.1)
Sodium: 139 meq/L (ref 135–145)
Total Bilirubin: 0.6 mg/dL (ref 0.2–1.2)
Total Protein: 7.1 g/dL (ref 6.0–8.3)

## 2023-08-10 LAB — LIPID PANEL
Cholesterol: 199 mg/dL (ref 0–200)
HDL: 59.2 mg/dL (ref >39.00)
LDL Cholesterol: 127 mg/dL — ABNORMAL HIGH (ref 0–99)
NonHDL: 139.65
Total CHOL/HDL Ratio: 3
Triglycerides: 62 mg/dL (ref 0.0–149.0)
VLDL: 12.4 mg/dL (ref 0.0–40.0)

## 2023-08-10 LAB — PSA: PSA: 1.09 ng/mL (ref 0.10–4.00)

## 2023-08-10 LAB — HEPATITIS C ANTIBODY: Hepatitis C Ab: NONREACTIVE

## 2023-08-10 LAB — HIV ANTIBODY (ROUTINE TESTING W REFLEX): HIV 1&2 Ab, 4th Generation: NONREACTIVE

## 2023-08-10 LAB — TSH: TSH: 1.09 u[IU]/mL (ref 0.35–5.50)

## 2023-08-10 LAB — HEMOGLOBIN A1C: Hgb A1c MFr Bld: 5.9 % (ref 4.6–6.5)

## 2023-09-22 ENCOUNTER — Encounter: Payer: Self-pay | Admitting: Family Medicine

## 2023-09-22 ENCOUNTER — Ambulatory Visit (INDEPENDENT_AMBULATORY_CARE_PROVIDER_SITE_OTHER): Payer: Self-pay | Admitting: Family Medicine

## 2023-09-22 VITALS — BP 130/78 | HR 69 | Temp 98.6°F | Ht 64.5 in | Wt 157.2 lb

## 2023-09-22 DIAGNOSIS — F419 Anxiety disorder, unspecified: Secondary | ICD-10-CM

## 2023-09-22 DIAGNOSIS — G4700 Insomnia, unspecified: Secondary | ICD-10-CM

## 2023-09-22 MED ORDER — SERTRALINE HCL 50 MG PO TABS: 50.0000 mg | ORAL_TABLET | Freq: Every day | ORAL | 1 refills | Status: DC

## 2023-09-22 MED ORDER — HYDROXYZINE PAMOATE 25 MG PO CAPS: ORAL_CAPSULE | ORAL | 5 refills | Status: DC

## 2023-09-22 NOTE — Patient Instructions (Signed)
 Thank you for coming in today.  I think we can try a slightly higher dose of sertraline .  That may help with sleep and anxiety symptoms further and lessen the need for hydroxyzine .  It is still okay to take the hydroxyzine  to sleep if needed.  Recheck in 5 months for repeat labs.  Follow-up sooner if any new or worsening symptoms.  Happy new year.

## 2023-09-22 NOTE — Progress Notes (Signed)
 Subjective:  Patient ID: Samuel Dennis, male    DOB: 1970-10-25  Age: 53 y.o. MRN: [id number]  CC:  Chief Complaint  Patient presents with   Anxiety    Pt notes sxs have been better    Insomnia    Pt notes sleeping better but could still be improved.      HPI [name] presents for   Follow-up from November 20 visit.  Multiple concerns discussed at that time.  Requested English language during most of last visit, interpreter was present for few points of clarification. Here with interpreter.   Anxiety/insomnia Improved sleep with use of hydroxyzine  from prior provider.  Suspected underlying anxiety plus or minus anxiety with depression and secondary insomnia.  Handout was given on managing depression and anxiety, added low-dose sertraline  at 25 mg daily to help treat anxiety symptoms.  TSH was normal.  Feeling better - both anxiety and sleep. 4-5 hrs sleep per night. Feels rested. Only taking hydroxyzine  as needed. 3-4 days per week. No side effects with sertraline .       09/22/2023    8:29 AM 08/09/2023    2:26 PM 07/07/2023   11:17 AM  GAD 7 : Generalized Anxiety Score  Nervous, Anxious, on Edge 1 1 1   Control/stop worrying 1 0 1  Worry too much - different things 1 0 1  Trouble relaxing 1 1 2   Restless 0 0 0  Easily annoyed or irritable 1 1 0  Afraid - awful might happen 0 0 0  Total GAD 7 Score 5 3 5   Anxiety Difficulty   Somewhat difficult      09/22/2023    8:28 AM 08/09/2023    2:26 PM 07/07/2023   11:16 AM 08/25/2018   10:00 AM 02/27/2018    9:02 AM  Depression screen PHQ 2/9  Decreased Interest 2 0 0 0 0  Down, Depressed, Hopeless 1 0 2 0 0  PHQ - 2 Score 3 0 2 0 0  Altered sleeping 1 1 2     Tired, decreased energy 0 0 1    Change in appetite 0 0 0    Feeling bad or failure about yourself  0 0 1    Trouble concentrating 0 0 1    Moving slowly or fidgety/restless 0 0 0    Suicidal thoughts 0 0 0    PHQ-9 Score 4 1 7     Difficult doing  work/chores   Somewhat difficult      History Patient Active Problem List   Diagnosis Date Noted   Anxiety 08/09/2023   Insomnia 08/09/2023   Lumbar pain 02/27/2018   Musculoskeletal pain 02/27/2018   Muscle spasm 02/27/2018   Past Medical History:  Diagnosis Date   Depression    Neuromuscular disorder (HCC)    No past surgical history on file. No Known Allergies Prior to Admission medications   Medication Sig Start Date End Date Taking? Authorizing Provider  hydrOXYzine  (VISTARIL ) 25 MG capsule Take 1-2 tablets at bedtime as needed for insomnia and anxiety. 07/07/23   Billy Philippe SAUNDERS, NP  sertraline  (ZOLOFT ) 25 MG tablet Take 1 tablet (25 mg total) by mouth daily. 08/09/23   Levora Reyes SAUNDERS, MD   Social History   Socioeconomic History   Marital status: Married    Spouse name: Not on file   Number of children: 3   Years of education: Not on file   Highest education level: 10th grade  Occupational History   Not on  file  Tobacco Use   Smoking status: Never   Smokeless tobacco: Never  Vaping Use   Vaping status: Never Used  Substance and Sexual Activity   Alcohol use: No    Alcohol/week: 0.0 standard drinks of alcohol   Drug use: No   Sexual activity: Yes  Other Topics Concern   Not on file  Social History Narrative   Not on file   Social Drivers of Health   Financial Resource Strain: Low Risk  (07/07/2023)   Overall Financial Resource Strain (CARDIA)    Difficulty of Paying Living Expenses: Not very hard  Food Insecurity: No Food Insecurity (07/07/2023)   Hunger Vital Sign    Worried About Running Out of Food in the Last Year: Never true    Ran Out of Food in the Last Year: Never true  Transportation Needs: No Transportation Needs (07/07/2023)   PRAPARE - Administrator, Civil Service (Medical): No    Lack of Transportation (Non-Medical): No  Physical Activity: Sufficiently Active (07/07/2023)   Exercise Vital Sign    Days of Exercise  per Week: 6 days    Minutes of Exercise per Session: 60 min  Stress: Stress Concern Present (07/07/2023)   [employer] of Occupational Health - Occupational Stress Questionnaire    Feeling of Stress : To some extent  Social Connections: Moderately Integrated (07/07/2023)   Social Connection and Isolation Panel [NHANES]    Frequency of Communication with Friends and Family: Three times a week    Frequency of Social Gatherings with Friends and Family: More than three times a week    Attends Religious Services: More than 4 times per year    Active Member of [employer] or Organizations: No    Attends Banker Meetings: Never    Marital Status: Married  Catering Manager Violence: Not At Risk (07/07/2023)   Humiliation, Afraid, Rape, and Kick questionnaire    Fear of Current or Ex-Partner: No    Emotionally Abused: No    Physically Abused: No    Sexually Abused: No    Review of Systems Per HPI.   Objective:   Vitals:   09/22/23 0830  BP: 130/78  Pulse: 69  Temp: 98.6 F (37 C)  TempSrc: Temporal  SpO2: 98%  Weight: 157 lb 3.2 oz (71.3 kg)  Height: 5' 4.5 (1.638 m)     Physical Exam Vitals reviewed.  Constitutional:      Appearance: He is well-developed.  HENT:     Head: Normocephalic and atraumatic.  Neck:     Vascular: No carotid bruit or JVD.  Cardiovascular:     Rate and Rhythm: Normal rate and regular rhythm.     Heart sounds: Normal heart sounds. No murmur heard. Pulmonary:     Effort: Pulmonary effort is normal.     Breath sounds: Normal breath sounds. No rales.  Musculoskeletal:     Right lower leg: No edema.     Left lower leg: No edema.  Skin:    General: Skin is warm and dry.  Neurological:     Mental Status: He is alert and oriented to person, place, and time.  Psychiatric:        Mood and Affect: Mood normal.        Behavior: Behavior normal.        Thought Content: Thought content normal.        Assessment & Plan:  Samuel Dennis is a 53 y.o. male . Anxiety -  Plan: sertraline  (ZOLOFT ) 50 MG tablet, hydrOXYzine  (VISTARIL ) 25 MG capsule  Insomnia, unspecified type - Plan: sertraline  (ZOLOFT ) 50 MG tablet, hydrOXYzine  (VISTARIL ) 25 MG capsule  Improved symptoms.  Still requiring hydroxyzine  intermittently.  Currently low-dose sertraline  without side effects.  I suspect he may need higher dosing for continued improvement of symptoms.  Will try 50 mg dose for now, continue hydroxyzine  as needed, recheck in 5 months and can repeat lipids, A1c at that time.  RTC precautions if new or worsening symptoms sooner.  Meds ordered this encounter  Medications   sertraline  (ZOLOFT ) 50 MG tablet    Sig: Take 1 tablet (50 mg total) by mouth daily.    Dispense:  90 tablet    Refill:  1   hydrOXYzine  (VISTARIL ) 25 MG capsule    Sig: Take 1-2 tablets at bedtime as needed for insomnia and anxiety.    Dispense:  30 capsule    Refill:  5   Patient Instructions  Thank you for coming in today.  I think we can try a slightly higher dose of sertraline .  That may help with sleep and anxiety symptoms further and lessen the need for hydroxyzine .  It is still okay to take the hydroxyzine  to sleep if needed.  Recheck in 5 months for repeat labs.  Follow-up sooner if any new or worsening symptoms.  Happy new year.    Signed,   Reyes [place], MD [facility], [facility] 09/22/23 8:43 AM

## 2024-01-31 ENCOUNTER — Telehealth: Payer: Self-pay

## 2024-01-31 NOTE — Telephone Encounter (Signed)
 Called Pt using [facility] due to PCP being out of office on scheduled appt date of 02/23/2024.  No answer and left HIPAA compliant VM through interpreter services. No MyChart msg has been sent at this time due to inactivity.

## 2024-02-23 ENCOUNTER — Ambulatory Visit: Payer: Self-pay | Admitting: Family Medicine

## 2024-06-28 ENCOUNTER — Encounter: Payer: Self-pay | Admitting: Family Medicine

## 2024-06-28 ENCOUNTER — Ambulatory Visit (INDEPENDENT_AMBULATORY_CARE_PROVIDER_SITE_OTHER): Payer: Self-pay | Admitting: Family Medicine

## 2024-06-28 VITALS — BP 114/78 | HR 68 | Temp 98.4°F | Resp 20 | Ht 64.5 in | Wt 164.0 lb

## 2024-06-28 DIAGNOSIS — R7303 Prediabetes: Secondary | ICD-10-CM

## 2024-06-28 DIAGNOSIS — Z1211 Encounter for screening for malignant neoplasm of colon: Secondary | ICD-10-CM

## 2024-06-28 DIAGNOSIS — Z0001 Encounter for general adult medical examination with abnormal findings: Secondary | ICD-10-CM

## 2024-06-28 DIAGNOSIS — Z13220 Encounter for screening for lipoid disorders: Secondary | ICD-10-CM

## 2024-06-28 DIAGNOSIS — Z0000 Encounter for general adult medical examination without abnormal findings: Secondary | ICD-10-CM

## 2024-06-28 DIAGNOSIS — B351 Tinea unguium: Secondary | ICD-10-CM

## 2024-06-28 MED ORDER — TERBINAFINE HCL 250 MG PO TABS: 250.0000 mg | ORAL_TABLET | Freq: Every day | ORAL | 1 refills | Status: AC

## 2024-06-28 NOTE — Progress Notes (Signed)
 Subjective:  Patient ID: Samuel Dennis, male    DOB: 07-14-1971  Age: 53 y.o. MRN: [id number]  CC:  Chief Complaint  Patient presents with   Annual Exam    Patient is not having head pain. SELF PAY   Arm Pain    Arm pain in both arms. Thinks its bone or nerve pain. Sx occurred over 3 weeks. Pain gone now    HPI [employer] presents for Annual Exam, english spoken - understanding spoken., interpreter declined.   PCP:me No other medical providers.   Bilateral arm pain: Occurred few weeks ago - shoulders. Has some body aches in different areas, but that has resolved.   Resolved at this time. No current pains or HA. No pain meds/nsaids.   Discolored toenails: Noted for years, discolored thick nails. No pain. Tried multile home remedies - no relief. Topical vs lamisil discussed with monitoring labs - chose lamisil. Limitations in efficacy, treatment duration discussed.  Lab Results  Component Value Date   ALT 24 08/09/2023   AST 25 08/09/2023   ALKPHOS 56 08/09/2023   BILITOT 0.6 08/09/2023     Anxiety/insomnia Last discussed in January.  Suspected underlying Zaidi plus or minus depression with secondary insomnia.  Has been treated with low-dose sertraline  25 mg daily to help with anxiety symptoms, hydroxyzine  has been used for sleep previously.  Improved at his January visit with use of sertraline  and hydroxyzine  only a few days per week at that time.  Has felt rested with only 4 to 5 hours of sleep previously.  Although improved, based on some persistent symptoms and need for hydroxyzine  his sertraline  was increased to 50 mg daily in January.  Stopped taking sertraline  - no side effects. Just didn't think he needed. Has been off hydroxyzine  as well for months - sleeping ok. Not feeling anxious or any sx's currently - would like to hold on additional meds.      09/22/2023    8:28 AM 08/09/2023    2:26 PM 07/07/2023   11:16 AM 08/25/2018   10:00 AM 02/27/2018    9:02 AM   Depression screen PHQ 2/9  Decreased Interest 2 0 0 0 0  Down, Depressed, Hopeless 1 0 2 0 0  PHQ - 2 Score 3 0 2 0 0  Altered sleeping 1 1 2     Tired, decreased energy 0 0 1    Change in appetite 0 0 0    Feeling bad or failure about yourself  0 0 1    Trouble concentrating 0 0 1    Moving slowly or fidgety/restless 0 0 0    Suicidal thoughts 0 0 0    PHQ-9 Score 4 1 7     Difficult doing work/chores   Somewhat difficult      Health Maintenance  Topic Date Due   COVID-19 Vaccine (3 - 2025-26 season) 07/14/2024 (Originally 05/20/2024)   Zoster Vaccines- Shingrix  (2 of 2) 09/28/2024 (Originally 10/04/2023)   Influenza Vaccine  12/17/2024 (Originally 04/19/2024)   Pneumococcal Vaccine: 50+ Years (1 of 1 - PCV) 06/28/2025 (Originally 05/11/2020)   Hepatitis B Vaccines 19-59 Average Risk (1 of 3 - 19+ 3-dose series) 06/28/2025 (Originally 05/11/1989)   Colonoscopy  06/28/2025 (Originally 05/12/2015)   DTaP/Tdap/Td (2 - Td or Tdap) 08/08/2033   Hepatitis C Screening  Completed   HIV Screening  Completed   HPV VACCINES  Aged Out   Meningococcal B Vaccine  Aged Out  Screening options with colonoscopy versus Cologuard discussed,  as well as fit test. Discussed timing of repeat testing intervals if normal, as well as potential need for diagnostic Colonoscopy if positive Cologuard. Understanding expressed, and chose Fit test. Did not submit cologuard.  Prostate: does  have family history of prostate cancer The natural history of prostate cancer and ongoing controversy regarding screening and potential treatment outcomes of prostate cancer has been discussed with the patient. The meaning of a false positive PSA and a false negative PSA has been discussed. He indicates understanding of the limitations of this screening test and wishes  to  defer screening PSA testing this year.  Lab Results  Component Value Date   PSA 1.09 08/09/2023     Immunization History  Administered Date(s) Administered    PFIZER(Purple Top)SARS-COV-2 Vaccination 12/12/2019, 01/03/2020   Tdap 08/09/2023   Zoster Recombinant(Shingrix ) 08/09/2023  Single dose of Shingrix  given last November. Pneumonia vaccine, flu vaccine, COVID booster, hepatitis B vaccines discussed, recommended.  No results found. Has optho - appt 2 years ago - vision stable - advised to schedule appt.   Dental: visit 1 yr ago - recommended to schedule.   Alcohol:none  Tobacco: none  Exercise: some home exercises. Physical job - Engineer, manufacturing systems.  Prediabetes: No new vision or urinary symptoms active as above. Due for repeat testing.  Lab Results  Component Value Date   HGBA1C 5.9 08/09/2023   Wt Readings from Last 3 Encounters:  06/28/24 164 lb (74.4 kg)  09/22/23 157 lb 3.2 oz (71.3 kg)  08/09/23 158 lb 9.6 oz (71.9 kg)     History Patient Active Problem List   Diagnosis Date Noted   Anxiety 08/09/2023   Insomnia 08/09/2023   Lumbar pain 02/27/2018   Musculoskeletal pain 02/27/2018   Muscle spasm 02/27/2018   Past Medical History:  Diagnosis Date   Depression    Neuromuscular disorder (HCC)    History reviewed. No pertinent surgical history. No Known Allergies Prior to Admission medications   Medication Sig Start Date End Date Taking? Authorizing Provider  hydrOXYzine  (VISTARIL ) 25 MG capsule Take 1-2 tablets at bedtime as needed for insomnia and anxiety. Patient not taking: Reported on 06/28/2024 09/22/23   Levora Reyes SAUNDERS, MD  sertraline  (ZOLOFT ) 50 MG tablet Take 1 tablet (50 mg total) by mouth daily. Patient not taking: Reported on 06/28/2024 09/22/23   Levora Reyes SAUNDERS, MD   Social History   Socioeconomic History   Marital status: Married    Spouse name: Not on file   Number of children: 3   Years of education: Not on file   Highest education level: 10th grade  Occupational History   Not on file  Tobacco Use   Smoking status: Never   Smokeless tobacco: Never  Vaping Use   Vaping status:  Never Used  Substance and Sexual Activity   Alcohol use: No    Alcohol/week: 0.0 standard drinks of alcohol   Drug use: No   Sexual activity: Yes  Other Topics Concern   Not on file  Social History Narrative   Not on file   Social Drivers of Health   Financial Resource Strain: Low Risk  (07/07/2023)   Overall Financial Resource Strain (CARDIA)    Difficulty of Paying Living Expenses: Not very hard  Food Insecurity: No Food Insecurity (07/07/2023)   Hunger Vital Sign    Worried About Running Out of Food in the Last Year: Never true    Ran Out of Food in the Last Year: Never true  Transportation  Needs: No Transportation Needs (07/07/2023)   PRAPARE - Administrator, Civil Service (Medical): No    Lack of Transportation (Non-Medical): No  Physical Activity: Sufficiently Active (07/07/2023)   Exercise Vital Sign    Days of Exercise per Week: 6 days    Minutes of Exercise per Session: 60 min  Stress: Stress Concern Present (07/07/2023)   [employer] of Occupational Health - Occupational Stress Questionnaire    Feeling of Stress : To some extent  Social Connections: Moderately Integrated (07/07/2023)   Social Connection and Isolation Panel    Frequency of Communication with Friends and Family: Three times a week    Frequency of Social Gatherings with Friends and Family: More than three times a week    Attends Religious Services: More than 4 times per year    Active Member of [employer] or Organizations: No    Attends Banker Meetings: Never    Marital Status: Married  Catering manager Violence: Not At Risk (07/07/2023)   Humiliation, Afraid, Rape, and Kick questionnaire    Fear of Current or Ex-Partner: No    Emotionally Abused: No    Physically Abused: No    Sexually Abused: No    Review of Systems 13 point review of systems per patient health survey noted.  Negative other than as indicated above or in HPI.    Objective:   Vitals:   06/28/24  1034  BP: 114/78  Pulse: 68  Resp: 20  Temp: 98.4 F (36.9 C)  TempSrc: Temporal  SpO2: 99%  Weight: 164 lb (74.4 kg)  Height: 5' 4.5 (1.638 m)     Physical Exam Vitals reviewed.  Constitutional:      Appearance: He is well-developed.  HENT:     Head: Normocephalic and atraumatic.     Right Ear: External ear normal.     Left Ear: External ear normal.  Eyes:     Conjunctiva/sclera: Conjunctivae normal.     Pupils: Pupils are equal, round, and reactive to light.  Neck:     Thyroid: No thyromegaly.  Cardiovascular:     Rate and Rhythm: Normal rate and regular rhythm.     Heart sounds: Normal heart sounds.  Pulmonary:     Effort: Pulmonary effort is normal. No respiratory distress.     Breath sounds: Normal breath sounds. No wheezing.  Abdominal:     General: There is no distension.     Palpations: Abdomen is soft.     Tenderness: There is no abdominal tenderness.  Musculoskeletal:        General: No tenderness. Normal range of motion.     Cervical back: Normal range of motion and neck supple.  Lymphadenopathy:     Cervical: No cervical adenopathy.  Skin:    General: Skin is warm and dry.     Comments: See photos, multiple discolored thickened toenails of feet without surrounding skin irritation or erythema.  Neurological:     Mental Status: He is alert and oriented to person, place, and time.     Deep Tendon Reflexes: Reflexes are normal and symmetric.  Psychiatric:        Behavior: Behavior normal.         Assessment & Plan:  Aneudy Champlain is a 53 y.o. male . Annual physical exam  - -anticipatory guidance as below in AVS, screening labs above. Health maintenance items as above in HPI discussed/recommended as applicable.   Onychomycosis - Plan: Comprehensive metabolic panel with GFR, terbinafine (  LAMISIL) 250 MG tablet, Hepatic function panel  - Various treatment options discussed including limitations of treatment, partial treatment possibilities or  need for repeat treatment.  Monitoring also discussed with use of Lamisil.  He would like to try Lamisil started 250 mg daily, baseline LFTs, recheck in 6 weeks.  Screen for colon cancer - Plan: POC FIT Test  - Screening options discussed, previously ordered Cologuard, agrees to fit test today.  Prediabetes - Plan: Hemoglobin A1c  - Diet/exercise approach.  Screening for hyperlipidemia - Plan: Comprehensive metabolic panel with GFR, Lipid panel   Meds ordered this encounter  Medications   terbinafine (LAMISIL) 250 MG tablet    Sig: Take 1 tablet (250 mg total) by mouth daily.    Dispense:  90 tablet    Refill:  1   Patient Instructions  Thank you for coming in today.  Lamisil once per day for toenail fungus.  This I will check liver test today but return in 6 weeks for repeat liver test lab visit to make sure that medication is tolerated.  As we discussed it can take a few months for the toenails to improve.   Complete the fit test for colon cancer screening at home, do not send that in, bring that test back here once completed so we can complete test.  If there are any concerns on your bloodwork, I will let you know. Take care!  Cuidados preventivos en los hombres de 40 a 64 aos de edad Preventive Care 62-31 Years Old, Male Los cuidados preventivos hacen referencia a las opciones en cuanto al estilo de vida y a las visitas al mdico, las cuales pueden promover la salud y Counsellor. Las visitas de cuidado preventivo tambin se denominan exmenes de Health visitor. Qu puedo esperar para mi visita de cuidado preventivo? Asesoramiento Durante la visita de cuidado preventivo, el mdico puede preguntarle sobre lo siguiente: Antecedentes mdicos, incluidos los siguientes: Problemas mdicos pasados. Antecedentes mdicos familiares. Salud actual, incluido lo siguiente: Su bienestar emocional. Training and development officer y las relaciones personales. Su actividad sexual. Estilo de vida, incluido  lo siguiente: Consumo de alcohol, nicotina, tabaco o drogas. Acceso a armas de fuego. Hbitos de alimentacin, ejercicio y sueo. Cuestiones de seguridad, como el uso de cinturn de seguridad y casco de bicicleta. Uso de pantalla solar. Su trabajo y [place] laboral. Examen fsico El mdico revisar lo siguiente: Diplomatic Services operational officer y [place]. Estos pueden usarse para calcular el IMC (ndice de masa corporal). El [facility] es una medicin que indica si tiene un peso saludable. Circunferencia de la cintura. Es ignacia medicin alrededor de Lobbyist. Esta medicin tambin indica si tiene un peso saludable y puede ayudar a predecir su riesgo de padecer ciertas enfermedades, como diabetes tipo 2 y presin arterial alta. Frecuencia cardaca y presin arterial. Temperatura corporal. Piel para detectar manchas anormales. Qu vacunas necesito?  Las vacunas se aplican a varias edades, segn un cronograma. El [employer] recomendar vacunas segn su edad, sus antecedentes mdicos, su estilo de vida y [address], como los viajes o el lugar donde trabaja. Qu pruebas necesito? Pruebas de deteccin El mdico puede recomendar pruebas de deteccin de ciertas afecciones. Esto puede incluir: Niveles de lpidos y colesterol. Pruebas de deteccin de la diabetes. Esto se Physiological scientist un control del azcar en la sangre (glucosa) despus de no haber comido durante un periodo de tiempo (ayuno). Prueba de hepatitis B. Prueba de hepatitis C. Prueba del VIH (virus de inmunodeficiencia humana). Pruebas de infecciones de transmisin  sexual (ITS), si est en riesgo. Pruebas de deteccin de cncer de pulmn. Examen de deteccin del cncer de prstata. Pruebas de deteccin de Building services engineer. Hable con su mdico sobre los [employer], las opciones de tratamiento y, si corresponde, la necesidad de Education officer, environmental ms pruebas. Siga estas instrucciones en su casa: Comida y bebida  Siga una dieta que incluya frutas y  verduras frescas, cereales integrales, protenas magras y productos lcteos descremados. Tome los suplementos vitamnicos y [employer] se lo haya indicado el mdico. No beba alcohol si el mdico se lo prohbe. Si bebe alcohol: Limite la cantidad que consume de 0 a 2 medidas por da. Sepa cunta cantidad de alcohol hay en las bebidas que toma. En los [address], una medida equivale a una botella de cerveza de 12 oz (355 ml), un vaso de vino de 5 oz (148 ml) o un vaso de una bebida alcohlica de alta graduacin de 1 oz (44 ml). Estilo de [employer] dientes a la maana y a la noche con Conservator, museum/gallery con fluoruro. Use hilo dental una vez al da. Haga al menos 30 minutos de ejercicio, 5 o ms [address]. No consuma ningn producto que contenga nicotina o tabaco. Estos productos incluyen cigarrillos, tabaco para Theatre manager y aparatos de vapeo, como los cigarrillos electrnicos. Si necesita ayuda para dejar de fumar, consulte al mdico. No consuma drogas. Si es sexualmente activo, practique sexo seguro. Use un condn u otra forma de proteccin para prevenir las infecciones de transmisin sexual (ITS). Tome aspirina nicamente como se lo haya indicado el mdico. Asegrese de que comprende qu cantidad y cul presentacin debe tomar. Trabaje con el mdico para averiguar si es seguro y beneficioso para usted tomar aspirina a diario. Busque maneras saludables de Charity fundraiser, tales como: Meditacin, yoga o escuchar msica. Lleve un diario personal. Hable con una persona confiable. Pase tiempo con amigos y familiares. Minimice la exposicin a la radiacin UV para reducir el riesgo de cncer de piel. Seguridad Usa  siempre el cinturn de seguridad al conducir o viajar en un vehculo. No conduzca: Si ha estado bebiendo alcohol. No viaje con un conductor que ha estado bebiendo. Si est cansado o distrado. Mientras est enviando mensajes de texto. Si ha estado usando sustancias o  drogas que alteran la funcin mental. Use un casco y otros equipos de proteccin durante las actividades deportivas. Si tiene armas de fuego en su casa, asegrese de seguir todos los procedimientos de seguridad correspondientes. Cundo volver? Acuda al mdico una vez al ao para una visita anual de control de bienestar. Pregntele al mdico con qu frecuencia debe realizarse un control de la vista y los dientes. Mantenga su esquema de vacunacin al da. Esta informacin no tiene Theme park manager el consejo del mdico. Asegrese de hacerle al mdico cualquier pregunta que tenga. Document Revised: 03/25/2021 Document Reviewed: 03/25/2021 Elsevier Patient Education  2024 Elsevier Inc.     Signed,   Reyes Pines, MD [facility], [facility] 06/28/24 11:27 AM

## 2024-06-28 NOTE — Patient Instructions (Addendum)
 Thank you for coming in today.  Lamisil once per day for toenail fungus.  This I will check liver test today but return in 6 weeks for repeat liver test lab visit to make sure that medication is tolerated.  As we discussed it can take a few months for the toenails to improve.   Complete the fit test for colon cancer screening at home, do not send that in, bring that test back here once completed so we can complete test.  If there are any concerns on your bloodwork, I will let you know. Take care!  Cuidados preventivos en los hombres de 40 a 64 aos de edad Preventive Care 75-45 Years Old, Male Los cuidados preventivos hacen referencia a las opciones en cuanto al estilo de vida y a las visitas al mdico, las cuales pueden promover la salud y Counsellor. Las visitas de cuidado preventivo tambin se denominan exmenes de Health visitor. Qu puedo esperar para mi visita de cuidado preventivo? Asesoramiento Durante la visita de cuidado preventivo, el mdico puede preguntarle sobre lo siguiente: Antecedentes mdicos, incluidos los siguientes: Problemas mdicos pasados. Antecedentes mdicos familiares. Salud actual, incluido lo siguiente: Su bienestar emocional. Training and development officer y las relaciones personales. Su actividad sexual. Estilo de vida, incluido lo siguiente: Consumo de alcohol, nicotina, tabaco o drogas. Acceso a armas de fuego. Hbitos de alimentacin, ejercicio y sueo. Cuestiones de seguridad, como el uso de cinturn de seguridad y casco de bicicleta. Uso de pantalla solar. Su trabajo y [place] laboral. Examen fsico El mdico revisar lo siguiente: Diplomatic Services operational officer y [place]. Estos pueden usarse para calcular el IMC (ndice de masa corporal). El [facility] es una medicin que indica si tiene un peso saludable. Circunferencia de la cintura. Es ignacia medicin alrededor de Lobbyist. Esta medicin tambin indica si tiene un peso saludable y puede ayudar a predecir su riesgo de padecer ciertas  enfermedades, como diabetes tipo 2 y presin arterial alta. Frecuencia cardaca y presin arterial. Temperatura corporal. Piel para detectar manchas anormales. Qu vacunas necesito?  Las vacunas se aplican a varias edades, segn un cronograma. El [employer] recomendar vacunas segn su edad, sus antecedentes mdicos, su estilo de vida y [address], como los viajes o el lugar donde trabaja. Qu pruebas necesito? Pruebas de deteccin El mdico puede recomendar pruebas de deteccin de ciertas afecciones. Esto puede incluir: Niveles de lpidos y colesterol. Pruebas de deteccin de la diabetes. Esto se Physiological scientist un control del azcar en la sangre (glucosa) despus de no haber comido durante un periodo de tiempo (ayuno). Prueba de hepatitis B. Prueba de hepatitis C. Prueba del VIH (virus de inmunodeficiencia humana). Pruebas de infecciones de transmisin sexual (ITS), si est en riesgo. Pruebas de deteccin de cncer de pulmn. Examen de deteccin del cncer de prstata. Pruebas de deteccin de Building services engineer. Hable con su mdico sobre los [employer], las opciones de tratamiento y, si corresponde, la necesidad de Education officer, environmental ms pruebas. Siga estas instrucciones en su casa: Comida y bebida  Siga una dieta que incluya frutas y verduras frescas, cereales integrales, protenas magras y productos lcteos descremados. Tome los suplementos vitamnicos y [employer] se lo haya indicado el mdico. No beba alcohol si el mdico se lo prohbe. Si bebe alcohol: Limite la cantidad que consume de 0 a 2 medidas por da. Sepa cunta cantidad de alcohol hay en las bebidas que toma. En los [place], una medida equivale a una botella de cerveza de 12 oz (355 ml), un vaso  de vino de 5 oz (148 ml) o un vaso de una bebida alcohlica de alta graduacin de 1 oz (44 ml). Estilo de [employer] dientes a la maana y a la noche con Conservator, museum/gallery con fluoruro. Use hilo dental  una vez al da. Haga al menos 30 minutos de ejercicio, 5 o ms [address]. No consuma ningn producto que contenga nicotina o tabaco. Estos productos incluyen cigarrillos, tabaco para Theatre manager y aparatos de vapeo, como los cigarrillos electrnicos. Si necesita ayuda para dejar de fumar, consulte al mdico. No consuma drogas. Si es sexualmente activo, practique sexo seguro. Use un condn u otra forma de proteccin para prevenir las infecciones de transmisin sexual (ITS). Tome aspirina nicamente como se lo haya indicado el mdico. Asegrese de que comprende qu cantidad y cul presentacin debe tomar. Trabaje con el mdico para averiguar si es seguro y beneficioso para usted tomar aspirina a diario. Busque maneras saludables de Charity fundraiser, tales como: Meditacin, yoga o escuchar msica. Lleve un diario personal. Hable con una persona confiable. Pase tiempo con amigos y familiares. Minimice la exposicin a la radiacin UV para reducir el riesgo de cncer de piel. Seguridad Usa  siempre el cinturn de seguridad al conducir o viajar en un vehculo. No conduzca: Si ha estado bebiendo alcohol. No viaje con un conductor que ha estado bebiendo. Si est cansado o distrado. Mientras est enviando mensajes de texto. Si ha estado usando sustancias o drogas que alteran la funcin mental. Use un casco y otros equipos de proteccin durante las actividades deportivas. Si tiene armas de fuego en su casa, asegrese de seguir todos los procedimientos de seguridad correspondientes. Cundo volver? Acuda al mdico una vez al ao para una visita anual de control de bienestar. Pregntele al mdico con qu frecuencia debe realizarse un control de la vista y los dientes. Mantenga su esquema de vacunacin al da. Esta informacin no tiene Theme park manager el consejo del mdico. Asegrese de hacerle al mdico cualquier pregunta que tenga. Document Revised: 03/25/2021 Document Reviewed:  03/25/2021 Elsevier Patient Education  2024 [employer].

## 2024-06-28 NOTE — Addendum Note (Signed)
 Addended by: Lashawna Poche R on: 06/28/2024 01:54 PM   Modules accepted: Orders

## 2024-08-09 ENCOUNTER — Other Ambulatory Visit: Payer: Self-pay

## 2024-12-27 ENCOUNTER — Ambulatory Visit: Payer: Self-pay | Admitting: Family Medicine
